# Patient Record
Sex: Female | Born: 1960
Health system: Southern US, Community
[De-identification: ages and names within clinical notes are randomized; demographics above are authoritative.]

## PROBLEM LIST (undated history)

## (undated) DIAGNOSIS — E079 Disorder of thyroid, unspecified: Secondary | ICD-10-CM

## (undated) DIAGNOSIS — N6019 Diffuse cystic mastopathy of unspecified breast: Secondary | ICD-10-CM

## (undated) DIAGNOSIS — E785 Hyperlipidemia, unspecified: Secondary | ICD-10-CM

## (undated) DIAGNOSIS — D649 Anemia, unspecified: Secondary | ICD-10-CM

## (undated) DIAGNOSIS — N924 Excessive bleeding in the premenopausal period: Secondary | ICD-10-CM

## (undated) DIAGNOSIS — E663 Overweight: Secondary | ICD-10-CM

## (undated) DIAGNOSIS — I10 Essential (primary) hypertension: Secondary | ICD-10-CM

## (undated) HISTORY — DX: Essential (primary) hypertension: I10

## (undated) HISTORY — DX: Overweight: E66.3

## (undated) HISTORY — DX: Anemia, unspecified: D64.9

## (undated) HISTORY — DX: Hyperlipidemia, unspecified: E78.5

## (undated) HISTORY — DX: Disorder of thyroid, unspecified: E07.9

## (undated) HISTORY — DX: Excessive bleeding in the premenopausal period: N92.4

## (undated) HISTORY — DX: Diffuse cystic mastopathy of unspecified breast: N60.19

## (undated) HISTORY — PX: TUBAL LIGATION: SHX77

---

## 2003-11-29 ENCOUNTER — Ambulatory Visit: Payer: Self-pay | Admitting: General Surgery

## 2004-12-06 ENCOUNTER — Ambulatory Visit: Payer: Self-pay | Admitting: General Surgery

## 2005-12-24 ENCOUNTER — Ambulatory Visit: Payer: Self-pay | Admitting: General Surgery

## 2006-03-03 ENCOUNTER — Ambulatory Visit: Payer: Self-pay | Admitting: Unknown Physician Specialty

## 2007-02-19 DIAGNOSIS — N924 Excessive bleeding in the premenopausal period: Secondary | ICD-10-CM

## 2007-02-19 HISTORY — DX: Excessive bleeding in the premenopausal period: N92.4

## 2007-02-19 HISTORY — PX: HYSTEROSCOPY: SHX211

## 2007-03-12 ENCOUNTER — Ambulatory Visit: Payer: Self-pay | Admitting: General Surgery

## 2007-12-04 ENCOUNTER — Ambulatory Visit: Payer: Self-pay | Admitting: Unknown Physician Specialty

## 2007-12-08 ENCOUNTER — Ambulatory Visit: Payer: Self-pay | Admitting: Unknown Physician Specialty

## 2008-02-19 HISTORY — PX: BIOPSY THYROID: PRO38

## 2008-05-04 ENCOUNTER — Ambulatory Visit: Payer: Self-pay | Admitting: General Surgery

## 2008-09-29 ENCOUNTER — Ambulatory Visit: Payer: Self-pay | Admitting: Unknown Physician Specialty

## 2009-02-18 DIAGNOSIS — E079 Disorder of thyroid, unspecified: Secondary | ICD-10-CM

## 2009-02-18 DIAGNOSIS — N6019 Diffuse cystic mastopathy of unspecified breast: Secondary | ICD-10-CM

## 2009-02-18 HISTORY — PX: BREAST BIOPSY: SHX20

## 2009-02-18 HISTORY — PX: COLONOSCOPY: SHX174

## 2009-02-18 HISTORY — DX: Disorder of thyroid, unspecified: E07.9

## 2009-02-18 HISTORY — DX: Diffuse cystic mastopathy of unspecified breast: N60.19

## 2009-03-10 ENCOUNTER — Ambulatory Visit: Payer: Self-pay | Admitting: Gastroenterology

## 2009-05-05 ENCOUNTER — Ambulatory Visit: Payer: Self-pay | Admitting: General Surgery

## 2009-12-22 IMAGING — US US THYROID
1 series · 17 of 25 positions shown · non-contrast
Comparison: none

REASON FOR EXAM: right thyroid nodule
COMMENTS:

[Series 1: us thyroid · 17 of 33 slices shown]
[im 1/33]
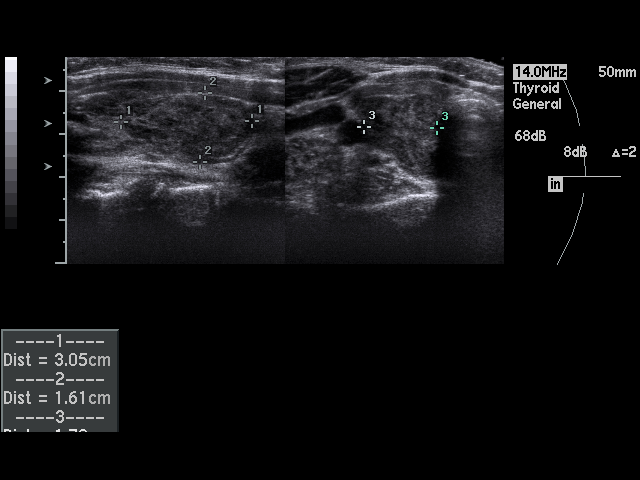
[im 3/33]
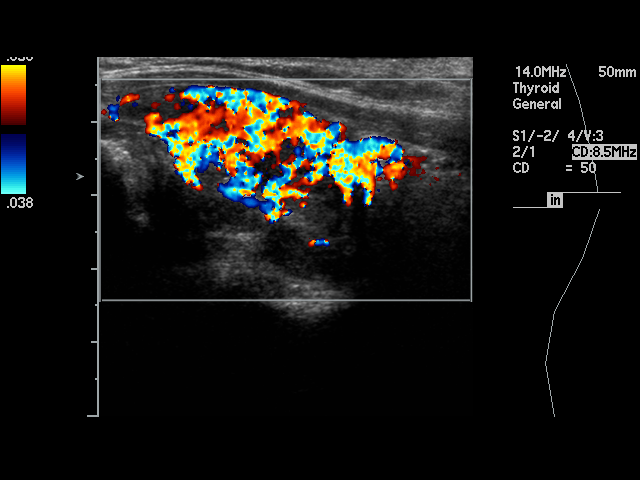
[im 5/33]
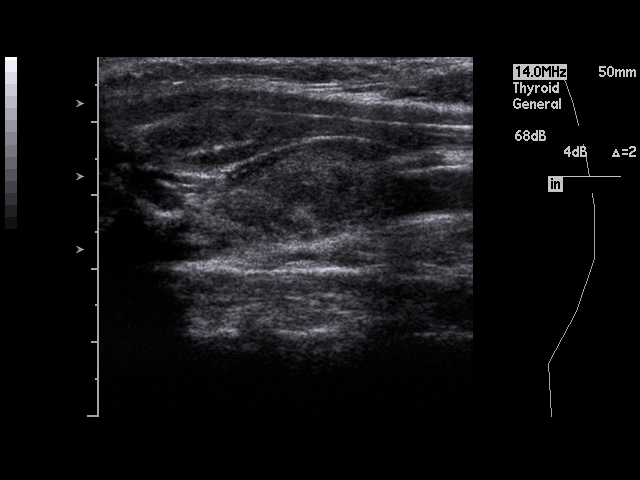
[im 7/33]
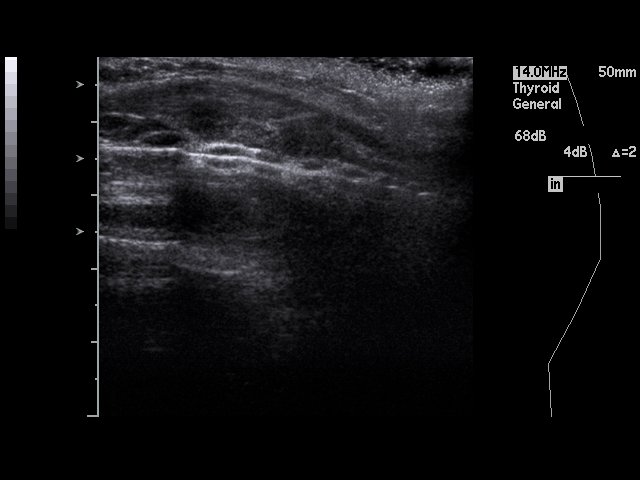
[im 9/33]
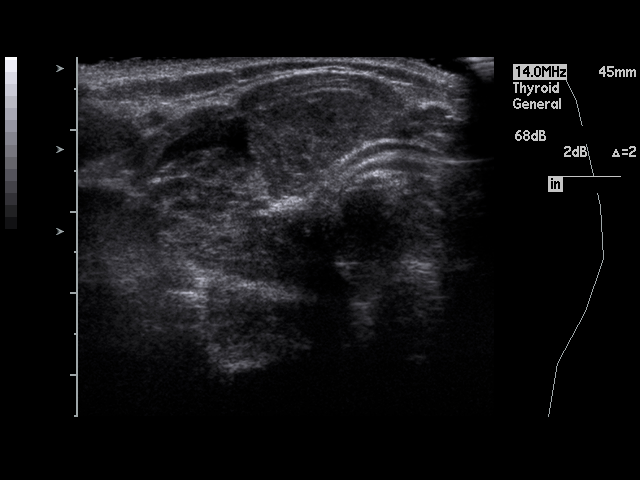
[im 11/33]
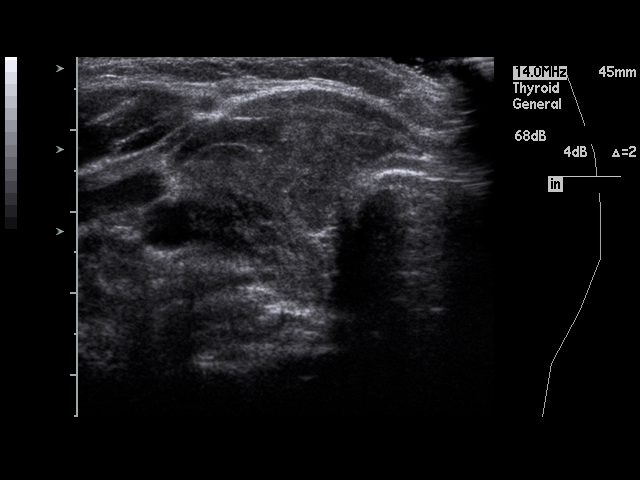
[im 13/33]
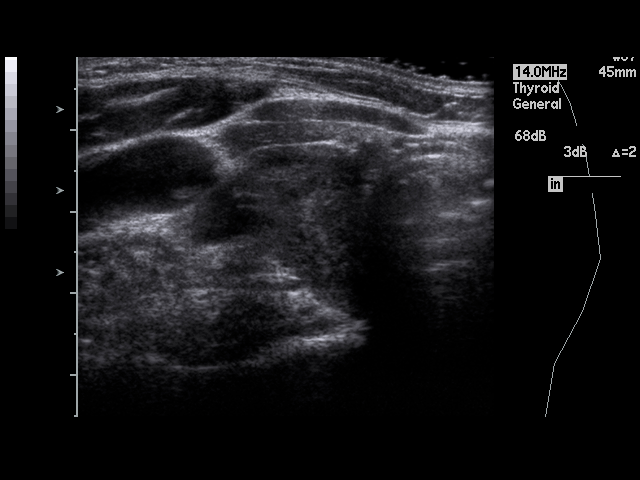
[im 15/33]
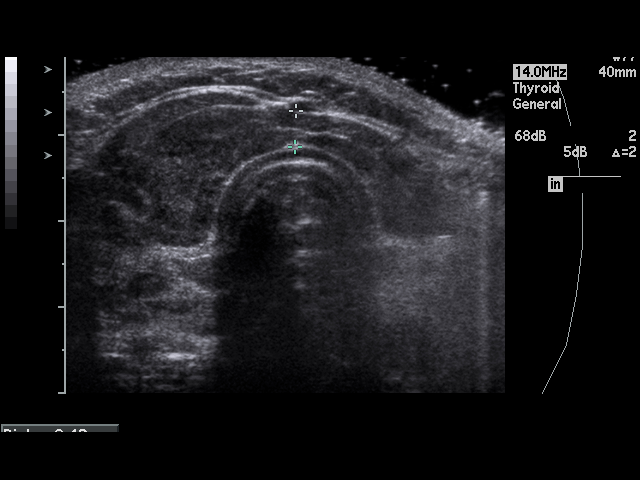
[im 17/33]
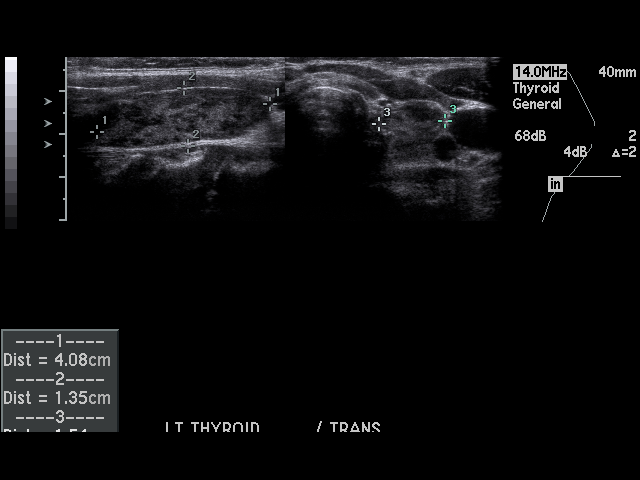
[im 18/33]
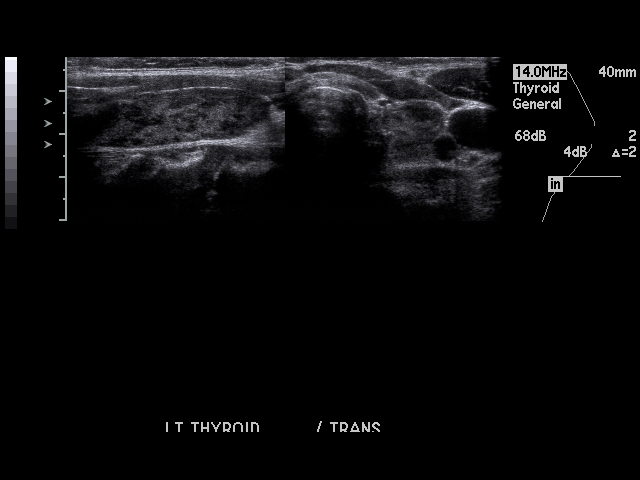
[im 21/33]
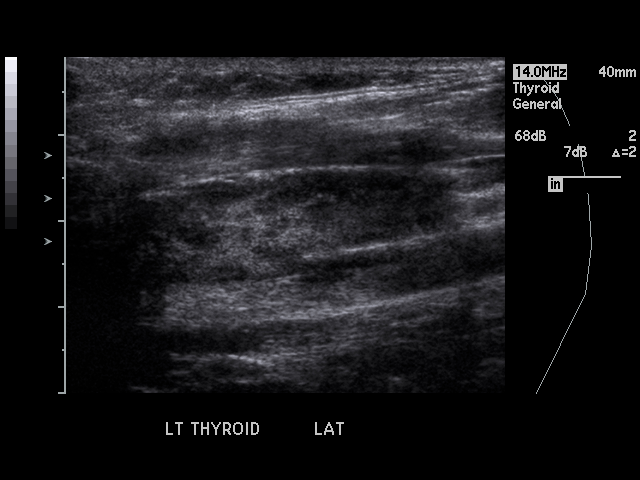
[im 22/33]
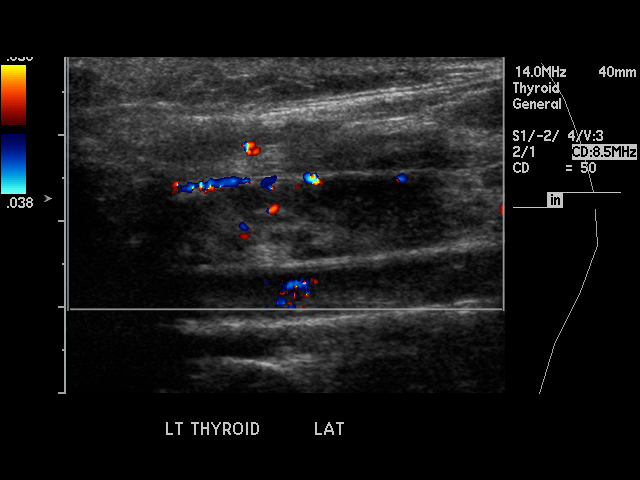
[im 25/33]
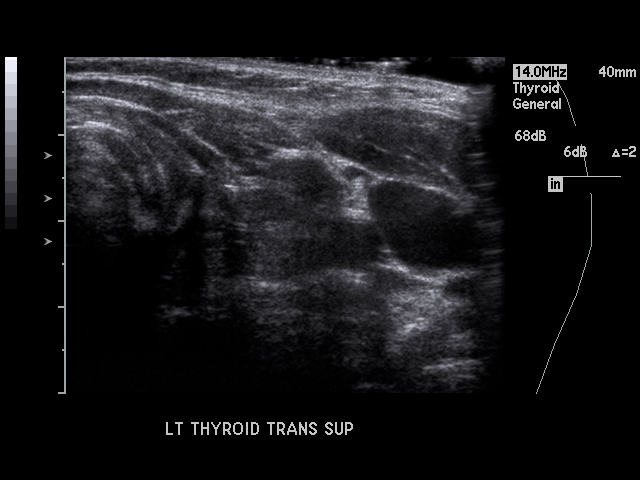
[im 26/33]
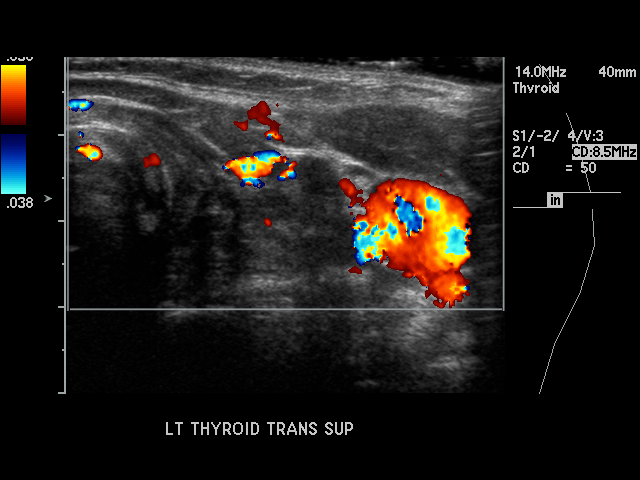
[im 29/33]
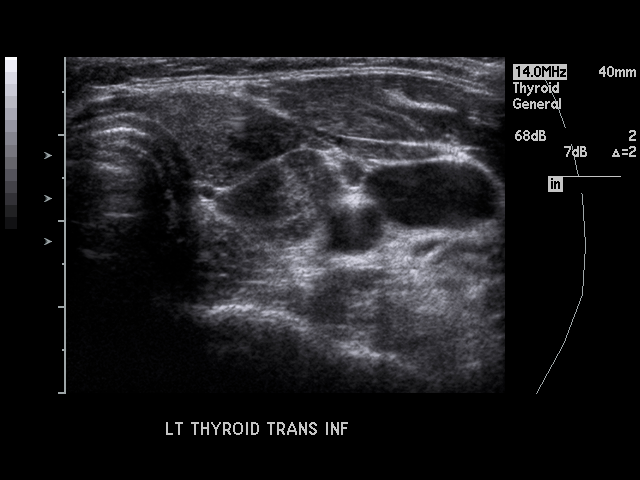
[im 30/33]
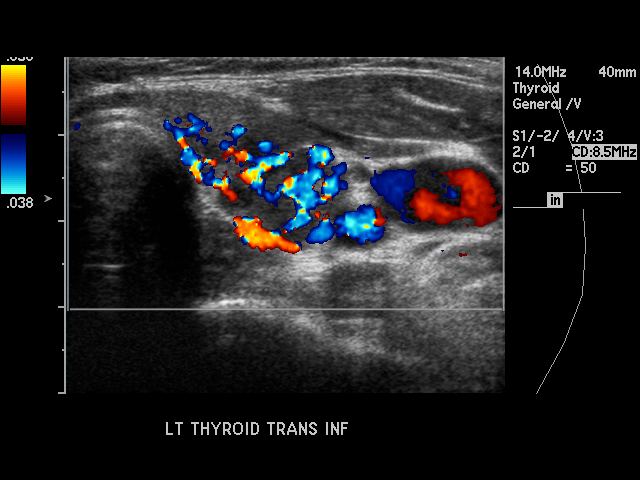
[im 33/33]
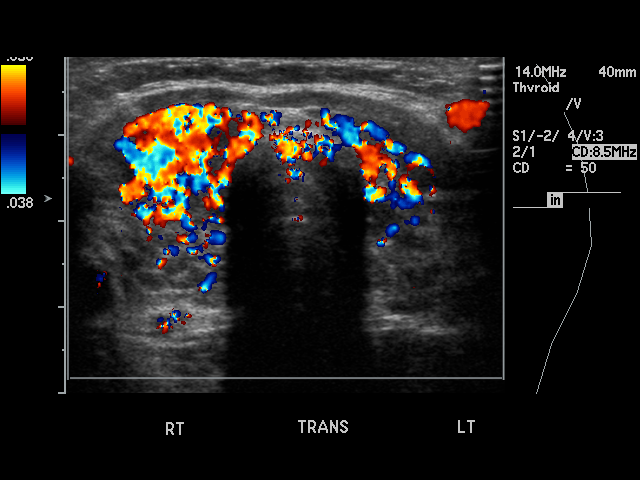

[17 of 25 positions shown; findings below may reference images not displayed]

PROCEDURE:     US  - US THYROID  - September 29, 2008  [DATE]

RESULT:     The right lobe of the thyroid measures 3.05 cm x 1.61 cm x
cm and the left lobe measures 4.08 cm x 1.35 cm x 1.54 cm. No focal thyroid
nodules or masses are seen. No thyroid calcifications are seen. The thyroid
echotexture is heterogeneous which would raise the question of thyroiditis.
IMPRESSION: 1.  No thyroid masses or nodules are seen.
2.  The thyroid echotexture is heterogeneous bilaterally. The finding is
nonspecific but can be the sequela of thyroiditis.

## 2010-05-23 ENCOUNTER — Ambulatory Visit: Payer: Self-pay | Admitting: General Surgery

## 2011-04-12 ENCOUNTER — Encounter: Payer: Self-pay | Admitting: Internal Medicine

## 2011-04-12 ENCOUNTER — Ambulatory Visit (INDEPENDENT_AMBULATORY_CARE_PROVIDER_SITE_OTHER): Payer: BC Managed Care – PPO | Admitting: Internal Medicine

## 2011-04-12 DIAGNOSIS — E663 Overweight: Secondary | ICD-10-CM

## 2011-04-12 DIAGNOSIS — I10 Essential (primary) hypertension: Secondary | ICD-10-CM

## 2011-04-12 DIAGNOSIS — Z6825 Body mass index (BMI) 25.0-25.9, adult: Secondary | ICD-10-CM

## 2011-04-12 NOTE — Progress Notes (Signed)
Subjective:    Patient ID: Kaitlyn Cortez, female    DOB: [birth year], 51 y.o.   MRN: [id number]  HPI   Kaitlyn Cortez is a 51 yr old white female with a history of hypertension and hypothyroidism who is transferring care for [facility].  Her cc is overweight.  She is not following a diet currently and walks occasionally for exercise.  Her weight gain has been recurrent since her 30's/  She has had previous success with Weight Watchers but doesn't have time currently for the program because of the documentation needed .  She denies chest pain with exertion and has no history of sleep apnea.   Past Medical History  Diagnosis Date  . Menopausal menorrhagia 2009    hysterosocopy was normal,  menses ended 2009  . Overweight (BMI 25.0-29.9)   . Thyroid disease   . Hypertension    No current outpatient prescriptions on file prior to visit.     Review of Systems  Constitutional: Negative for fever, chills and unexpected weight change.  HENT: Negative for hearing loss, ear pain, nosebleeds, congestion, sore throat, facial swelling, rhinorrhea, sneezing, mouth sores, trouble swallowing, neck pain, neck stiffness, voice change, postnasal drip, sinus pressure, tinnitus and ear discharge.   Eyes: Negative for pain, discharge, redness and visual disturbance.  Respiratory: Negative for cough, chest tightness, shortness of breath, wheezing and stridor.   Cardiovascular: Negative for chest pain, palpitations and leg swelling.  Musculoskeletal: Negative for myalgias and arthralgias.  Skin: Negative for color change and rash.  Neurological: Negative for dizziness, weakness, light-headedness and headaches.  Hematological: Negative for adenopathy.       Objective:   Physical Exam  Constitutional: She is oriented to person, place, and time. She appears well-developed and well-nourished.  HENT:  Mouth/Throat: Oropharynx is clear and moist.  Eyes: EOM are normal. Pupils are equal, round, and reactive  to light. No scleral icterus.  Neck: Normal range of motion. Neck supple. No JVD present. No thyromegaly present.  Cardiovascular: Normal rate, regular rhythm, normal heart sounds and intact distal pulses.   Pulmonary/Chest: Effort normal and breath sounds normal.  Abdominal: Soft. Bowel sounds are normal. She exhibits no mass. There is no tenderness.  Musculoskeletal: Normal range of motion. She exhibits no edema.  Lymphadenopathy:    She has no cervical adenopathy.  Neurological: She is alert and oriented to person, place, and time.  Skin: Skin is warm and dry.  Psychiatric: She has a normal mood and affect.       Assessment & Plan: Overweight (BMI 25.0-29.9) Discussed low glycemic index diet,  6 smaller meals daily,  And need for daily exercise.   Hypertension well controlled  On current regimen. No changes today.     Updated Medication List Outpatient Encounter Prescriptions as of 04/12/2011  Medication Sig Dispense Refill  . levothyroxine (LEVOTHROID) 25 MCG tablet Take 25 mcg by mouth daily.      . Triamterene-HCTZ (MAXZIDE PO) Take one by mouth daily, patient unsure of strength          Overweight (BMI 25.0-29.9) Spent 20 minutes discussing low glycemic index diet,  6 smaller meals daily,  And need for daily.  Baseline labs ordered.   Hypertension well controlled  On current regimen. No changes today.     Updated Medication List Outpatient Encounter Prescriptions as of 04/12/2011  Medication Sig Dispense Refill  . levothyroxine (LEVOTHROID) 25 MCG tablet Take 25 mcg by mouth daily.      . Triamterene-HCTZ (  MAXZIDE PO) Take one by mouth daily, patient unsure of strength

## 2011-04-12 NOTE — Patient Instructions (Addendum)
Low glycemic index diet, eating 6 smaller meals daily  Protein  Shakes (EAS Carb Control  Or Atkins ,  Available everywhere,   In  cases at BJs )  2.5 carbs  Protein bar by Atkins (snack size,  BJ's)  At 10 am  Lunch: sandwich on pita bread (Joseph's makes a pita bread and a flat bread , available at [employer] and BJ's)   Mid day :  Another protein bar,  Or cheese stick, almonds, walnuts, pistachios, pecans, peanuts,  Macadamia nuts  Lunch:  "mean and green:"  Meat, salad, and green veggie : use ranch, vinagrette,  Blue cheese, etc  Evening : Breyer's low carb fudgiscle, ice cream (Carb Smart)   

## 2011-04-14 ENCOUNTER — Encounter: Payer: Self-pay | Admitting: Internal Medicine

## 2011-04-14 DIAGNOSIS — E663 Overweight: Secondary | ICD-10-CM | POA: Insufficient documentation

## 2011-04-14 DIAGNOSIS — E039 Hypothyroidism, unspecified: Secondary | ICD-10-CM | POA: Insufficient documentation

## 2011-04-14 NOTE — Assessment & Plan Note (Addendum)
Spent 20 minutes discussing low glycemic index diet,  6 smaller meals daily,  And need for daily.  Baseline labs ordered.

## 2011-04-14 NOTE — Assessment & Plan Note (Signed)
well controlled  On current regimen. No changes today.

## 2011-05-23 ENCOUNTER — Ambulatory Visit: Payer: Self-pay | Admitting: General Surgery

## 2011-06-04 ENCOUNTER — Other Ambulatory Visit: Payer: Self-pay | Admitting: Internal Medicine

## 2011-06-04 MED ORDER — TRIAMTERENE-HCTZ 37.5-25 MG PO TABS: 1.0000 | ORAL_TABLET | Freq: Every day | ORAL | Status: DC

## 2011-07-03 ENCOUNTER — Other Ambulatory Visit: Payer: Self-pay | Admitting: General Surgery

## 2011-07-03 LAB — TSH: Thyroid Stimulating Horm: 1.36 u[IU]/mL

## 2011-07-10 ENCOUNTER — Encounter: Payer: Self-pay | Admitting: Internal Medicine

## 2011-07-10 ENCOUNTER — Ambulatory Visit (INDEPENDENT_AMBULATORY_CARE_PROVIDER_SITE_OTHER): Payer: BC Managed Care – PPO | Admitting: Internal Medicine

## 2011-07-10 VITALS — BP 142/80 | HR 70 | Temp 98.3°F | Resp 16 | Wt 142.8 lb

## 2011-07-10 DIAGNOSIS — I10 Essential (primary) hypertension: Secondary | ICD-10-CM

## 2011-07-10 DIAGNOSIS — E785 Hyperlipidemia, unspecified: Secondary | ICD-10-CM

## 2011-07-10 DIAGNOSIS — E663 Overweight: Secondary | ICD-10-CM

## 2011-07-10 DIAGNOSIS — E079 Disorder of thyroid, unspecified: Secondary | ICD-10-CM

## 2011-07-10 NOTE — Progress Notes (Signed)
Patient ID: Kaitlyn Cortez, female   DOB: [birth year], 51 y.o.   MRN: [id number]   Patient Active Problem List  Diagnoses  . Hypertension  . Thyroid disease  . Overweight (BMI 25.0-29.9)  . Hyperlipidemia  . Anemia    Subjective:  CC:   Chief Complaint  Patient presents with  . Follow-up    HPI:   Kaitlyn Cortez a 51 y.o. female who presents for 3 month follow up on low glycemic index diet and hypothyroidism.  She has more energy and less fatigue on followup.  Her thyroid was low 8 weeks ago and dose was increased with repeat level improved.  She is better .  She is walking daily  But admits that her level of intensity is low .   Past Medical History  Diagnosis Date  . Menopausal menorrhagia 2009    hysterosocopy was normal,  menses ended 2009  . Overweight (BMI 25.0-29.9)   . Hyperlipidemia   . hypothyroidism   . Hypertension   . Anemia     Past Surgical History  Procedure Date  . Biopsy thyroid 2010    normal, Evette Cristal  takes low dose synthroid for suppression  . Tubal ligation   . Breast biopsy     fibrocystic breast disease         The following portions of the patient's history were reviewed and updated as appropriate: Allergies, current medications, and problem list.    Review of Systems:   12 Pt  review of systems was negative except those addressed in the HPI,     History   Social History  . Marital Status: Married    Spouse Name: N/A    Number of Children: N/A  . Years of Education: N/A   Occupational History  . Not on file.   Social History Main Topics  . Smoking status: Never Smoker   . Smokeless tobacco: Never Used  . Alcohol Use: Yes  . Drug Use: No  . Sexually Active: Not on file   Other Topics Concern  . Not on file   Social History Narrative  . No narrative on file    Objective:  BP 142/80  Pulse 70  Temp(Src) 98.3 F (36.8 C) (Oral)  Resp 16  Wt 142 lb 12 oz (64.751 kg)  SpO2 98%  General appearance: alert,  cooperative and appears stated age Ears: normal TM's and external ear canals both ears Throat: lips, mucosa, and tongue normal; teeth and gums normal Neck: no adenopathy, no carotid bruit, supple, symmetrical, trachea midline and thyroid not enlarged, symmetric, no tenderness/mass/nodules Back: symmetric, no curvature. ROM normal. No CVA tenderness. Lungs: clear to auscultation bilaterally Heart: regular rate and rhythm, S1, S2 normal, no murmur, click, rub or gallop Abdomen: soft, non-tender; bowel sounds normal; no masses,  no organomegaly Pulses: 2+ and symmetric Skin: Skin color, texture, turgor normal. No rashes or lesions Lymph nodes: Cervical, supraclavicular, and axillary nodes normal.  Assessment and Plan:  Overweight (BMI 25.0-29.9) Improving with exercise and low glycemin index diet. Wt loss of 7 lbs noted.  Return in 3 months  Hypertension Well controlled on current medications.  No changes today.  Hyperlipidemia Last LDL was 107 by [facility] records in December.  Repeat due prior to next visit.   Thyroid disease Stable MNG by  prior records,  TSH was subtherapuetic and dose was increased at last visit.     Updated Medication List Outpatient Encounter Prescriptions as of 07/10/2011  Medication Sig Dispense Refill  .  levothyroxine (SYNTHROID, LEVOTHROID) 75 MCG tablet Take 75 mcg by mouth daily.      Marland Kitchen triamterene-hydrochlorothiazide (MAXZIDE-25) 37.5-25 MG per tablet Take 1 each (1 tablet total) by mouth daily.  90 tablet  3  . DISCONTD: levothyroxine (LEVOTHROID) 25 MCG tablet Take 25 mcg by mouth daily.         Orders Placed This Encounter  Procedures  . HM COLONOSCOPY    Return in about 3 months (around 10/10/2011).

## 2011-07-10 NOTE — Patient Instructions (Signed)
Sign up for my chart so you can e mail me your questions (nonurgent)

## 2011-07-14 ENCOUNTER — Encounter: Payer: Self-pay | Admitting: Internal Medicine

## 2011-07-14 DIAGNOSIS — D649 Anemia, unspecified: Secondary | ICD-10-CM | POA: Insufficient documentation

## 2011-07-14 DIAGNOSIS — E785 Hyperlipidemia, unspecified: Secondary | ICD-10-CM | POA: Insufficient documentation

## 2011-07-14 NOTE — Assessment & Plan Note (Signed)
Last LDL was 107 by [facility] records in December.  Repeat due prior to next visit.

## 2011-07-14 NOTE — Assessment & Plan Note (Signed)
Improving with exercise and low glycemin index diet. Wt loss of 7 lbs noted.  Return in 3 months

## 2011-07-14 NOTE — Assessment & Plan Note (Signed)
Stable MNG by  prior records,  TSH was subtherapuetic and dose was increased at last visit.

## 2011-07-14 NOTE — Assessment & Plan Note (Signed)
Well controlled on current medications.  No changes today. 

## 2011-08-19 ENCOUNTER — Ambulatory Visit (INDEPENDENT_AMBULATORY_CARE_PROVIDER_SITE_OTHER): Payer: BC Managed Care – PPO | Admitting: Internal Medicine

## 2011-08-19 ENCOUNTER — Encounter: Payer: Self-pay | Admitting: Internal Medicine

## 2011-08-19 VITALS — BP 114/70 | HR 68 | Temp 97.6°F | Resp 16

## 2011-08-19 DIAGNOSIS — E079 Disorder of thyroid, unspecified: Secondary | ICD-10-CM

## 2011-08-19 DIAGNOSIS — J069 Acute upper respiratory infection, unspecified: Secondary | ICD-10-CM | POA: Insufficient documentation

## 2011-08-19 MED ORDER — AMOXICILLIN-POT CLAVULANATE 875-125 MG PO TABS: 1.0000 | ORAL_TABLET | Freq: Two times a day (BID) | ORAL | Status: AC

## 2011-08-19 MED ORDER — PSEUDOEPHEDRINE-CODEINE-GG 30-10-100 MG/5ML PO SOLN: 10.0000 mL | Freq: Four times a day (QID) | ORAL | Status: AC | PRN

## 2011-08-19 NOTE — Progress Notes (Signed)
Patient ID: Kaitlyn Cortez, female   DOB: 02-11-1961, 51 y.o.   MRN: [id number] Patient Active Problem List  Diagnosis  . Hypertension  . Thyroid disease  . Overweight (BMI 25.0-29.9)  . Hyperlipidemia  . Anemia  . URI, acute    Subjective:  CC:   Chief Complaint  Patient presents with  . Sore Throat  . Cough    HPI:   Kaitlyn Cortez a 51 y.o. female who presents ough, sore throat.  After spending a week at the beach she developed post nasal drip and recurrent nonproductive cough with raspy voice and sore throat.  Sister in law having similar symptoms. No fevers, facial pain, purulent nasal drainage or  Pleurisy.  No sneezing or allergy symptoms.  Cough is constant,  using robitussin and  chloraseptic with no significant change.   Past Medical History  Diagnosis Date  . Menopausal menorrhagia 2009    hysterosocopy was normal,  menses ended 2009  . Overweight (BMI 25.0-29.9)   . Hyperlipidemia   . hypothyroidism   . Hypertension   . Anemia     Past Surgical History  Procedure Date  . Biopsy thyroid 2010    normal, Evette Cristal  takes low dose synthroid for suppression  . Tubal ligation   . Breast biopsy     fibrocystic breast disease         The following portions of the patient's history were reviewed and updated as appropriate: Allergies, current medications, and problem list.    Review of Systems:   12 Pt  review of systems was negative except those addressed in the HPI,     History   Social History  . Marital Status: Married    Spouse Name: N/A    Number of Children: N/A  . Years of Education: N/A   Occupational History  . Not on file.   Social History Main Topics  . Smoking status: Never Smoker   . Smokeless tobacco: Never Used  . Alcohol Use: Yes  . Drug Use: No  . Sexually Active: Not on file   Other Topics Concern  . Not on file   Social History Narrative  . No narrative on file    Objective:  BP 114/70  Pulse 68  Temp 97.6 F  (36.4 C) (Oral)  Resp 16  SpO2 97%  General appearance: alert, cooperative and appears stated age Ears: normal TM's and external ear canals both ears Throat: lips, mucosa, and tongue normal; tonisllar pillars are red without congestion or ulceration Neck: no adenopathy, no carotid bruit, supple, symmetrical, trachea midline and thyroid not enlarged, symmetric, no tenderness/mass/nodules Back: symmetric, no curvature. ROM normal. No CVA tenderness. Lungs: clear to auscultation bilaterally Heart: regular rate and rhythm, S1, S2 normal, no murmur, click, rub or gallop Lymph nodes: Cervical, supraclavicular, and axillary nodes normal.  Assessment and Plan: URI, acute Given absence of facial pain and exam consistent with vital URI,  Will treat with cough suppressants decongestants, and saline lavage.  Adding augmentin if no improvement in 4 days   Thyroid disease Repeat TSH on 75 mcg doe has been normal per patient. Checked by [employer].    Updated Medication List Outpatient Encounter Prescriptions as of 08/19/2011  Medication Sig Dispense Refill  . levothyroxine (SYNTHROID, LEVOTHROID) 75 MCG tablet Take 75 mcg by mouth daily.      Marland Kitchen triamterene-hydrochlorothiazide (MAXZIDE-25) 37.5-25 MG per tablet Take 1 each (1 tablet total) by mouth daily.  90 tablet  3  . amoxicillin-clavulanate (AUGMENTIN)  875-125 MG per tablet Take 1 tablet by mouth 2 (two) times daily.  14 tablet  0  . pseudoephedrine-codeine-guaifenesin (CHERATUSSIN DAC) 30-10-100 MG/5ML solution Take 10 mLs by mouth 4 (four) times daily as needed for cough.  480 mL  0

## 2011-08-19 NOTE — Assessment & Plan Note (Addendum)
Repeat TSH on 75 mcg doe has been normal per patient. Checked by [employer].

## 2011-08-19 NOTE — Patient Instructions (Addendum)
You have a viral  Syndrome .  The post nasal drip is causing your sore throat.  Lavage your sinuses twice daly with Simply saline nasal spray.  Use benadryl 25 mg every 8 hours and Sudafed PE 10 to 30 every 8 hours to manage the drainage and congestion.  Gargle with salt water often for the sore throat.  I am calling in Cheratussin cough syrup (has codeine) for the cough.  If the throat is no better  In 3 to 4 days OR  if you develop T > 100.4,  Green nasal discharge,  Or facial pain,  Start the  antibiotic.   

## 2011-08-19 NOTE — Assessment & Plan Note (Signed)
Given absence of facial pain and exam consistent with vital URI,  Will treat with cough suppressants decongestants, and saline lavage.  Adding augmentin if no improvement in 4 days

## 2011-10-10 ENCOUNTER — Ambulatory Visit: Payer: BC Managed Care – PPO | Admitting: Internal Medicine

## 2012-04-10 ENCOUNTER — Encounter: Payer: Self-pay | Admitting: *Deleted

## 2012-04-10 DIAGNOSIS — N6019 Diffuse cystic mastopathy of unspecified breast: Secondary | ICD-10-CM | POA: Insufficient documentation

## 2012-05-25 ENCOUNTER — Encounter: Payer: Self-pay | Admitting: General Surgery

## 2012-05-25 ENCOUNTER — Ambulatory Visit: Payer: Self-pay | Admitting: General Surgery

## 2012-06-08 ENCOUNTER — Encounter: Payer: Self-pay | Admitting: *Deleted

## 2012-06-09 ENCOUNTER — Ambulatory Visit (INDEPENDENT_AMBULATORY_CARE_PROVIDER_SITE_OTHER): Payer: BC Managed Care – PPO | Admitting: General Surgery

## 2012-06-09 ENCOUNTER — Encounter: Payer: Self-pay | Admitting: General Surgery

## 2012-06-09 ENCOUNTER — Other Ambulatory Visit: Payer: Self-pay | Admitting: *Deleted

## 2012-06-09 VITALS — BP 124/84 | HR 70 | Resp 12 | Ht 61.0 in | Wt 149.0 lb

## 2012-06-09 DIAGNOSIS — N6019 Diffuse cystic mastopathy of unspecified breast: Secondary | ICD-10-CM

## 2012-06-09 DIAGNOSIS — Z1231 Encounter for screening mammogram for malignant neoplasm of breast: Secondary | ICD-10-CM

## 2012-06-09 NOTE — Progress Notes (Signed)
Patient ID: Kaitlyn Cortez, female   DOB: 05-06-1960, 52 y.o.   MRN: [id number]  Chief Complaint  Patient presents with  . Follow-up    mammogram    HPI Kaitlyn Cortez is a 52 y.o. female who presents for a follow up mammogram. The most recent mammogram was done on 05/25/12 at [facility] with a birad category 2. No new problems with the breast. The patient gets regular mammograms and checks her breasts on occasion. No known family history. The patient has a history of fibrocystic breast disease.  HPI  Past Medical History  Diagnosis Date  . Menopausal menorrhagia 2009    hysterosocopy was normal,  menses ended 2009  . Overweight (BMI 25.0-29.9)   . Hyperlipidemia   . hypothyroidism   . Hypertension   . Anemia   . Diffuse cystic mastopathy 2011  . Unspecified disorder of thyroid 2011    Past Surgical History  Procedure Laterality Date  . Biopsy thyroid  2010    normal, Evette Cristal  takes low dose synthroid for suppression  . Tubal ligation    . Breast biopsy  2011    fibrocystic breast disease  . Hysteroscopy  2009  . Colonoscopy  2011    Family History  Problem Relation Age of Onset  . Heart disease Father   . Heart disease Maternal Uncle   . Depression Daughter     Social History History  Substance Use Topics  . Smoking status: Never Smoker   . Smokeless tobacco: Never Used  . Alcohol Use: Yes    No Known Allergies  Current Outpatient Prescriptions  Medication Sig Dispense Refill  . levothyroxine (SYNTHROID, LEVOTHROID) 75 MCG tablet Take 75 mcg by mouth daily.      Marland Kitchen triamterene-hydrochlorothiazide (MAXZIDE-25) 37.5-25 MG per tablet Take 1 each (1 tablet total) by mouth daily.  90 tablet  3   No current facility-administered medications for this visit.    Review of Systems Review of Systems  Constitutional: Negative.   Respiratory: Negative.   Cardiovascular: Negative.     Blood pressure 124/84, pulse 70, resp. rate 12, height 5\' 1"  (1.549 m), weight 149 lb  (67.586 kg).  Physical Exam Physical Exam  Constitutional: She appears well-developed and well-nourished.  Neck: Normal range of motion. Neck supple.  Cardiovascular: Normal rate and normal heart sounds.   Pulmonary/Chest: Effort normal and breath sounds normal. Right breast exhibits no inverted nipple, no mass, no nipple discharge, no skin change and no tenderness. Left breast exhibits no inverted nipple, no mass, no nipple discharge, no skin change and no tenderness.  Abdominal: Soft. Bowel sounds are normal.  Lymphadenopathy:    She has no cervical adenopathy.    She has no axillary adenopathy.    Data Reviewed Mammogram reviewed and is stable  Assessment    Stable exam    Plan    Routine yearly followup       Verley Pariseau G 06/10/2012, 6:38 AM

## 2012-06-09 NOTE — Patient Instructions (Addendum)
Return in one year with mammogram.

## 2012-06-09 NOTE — Progress Notes (Signed)
Patient will be asked to return to the office in one year for a bilateral screening mammogram. 

## 2012-06-10 ENCOUNTER — Encounter: Payer: Self-pay | Admitting: General Surgery

## 2012-07-31 ENCOUNTER — Other Ambulatory Visit: Payer: Self-pay | Admitting: Internal Medicine

## 2012-07-31 ENCOUNTER — Telehealth: Payer: Self-pay | Admitting: Internal Medicine

## 2012-07-31 NOTE — Telephone Encounter (Signed)
Triamterene-HCTZ (MAXZIDE PO)

## 2012-07-31 NOTE — Telephone Encounter (Signed)
Has CPE scheduled 09/18/12.

## 2012-07-31 NOTE — Telephone Encounter (Signed)
Refill done, see refill note.

## 2012-09-17 ENCOUNTER — Telehealth: Payer: Self-pay | Admitting: *Deleted

## 2012-09-17 NOTE — Telephone Encounter (Signed)
Patient called wanting a precsription refill on Synthroid 0.075 mg. She uses [facility] in [place]. Thanks.

## 2012-09-18 ENCOUNTER — Encounter: Payer: Self-pay | Admitting: Internal Medicine

## 2012-09-18 ENCOUNTER — Ambulatory Visit (INDEPENDENT_AMBULATORY_CARE_PROVIDER_SITE_OTHER): Payer: BC Managed Care – PPO | Admitting: Internal Medicine

## 2012-09-18 VITALS — BP 134/78 | HR 63 | Temp 98.1°F | Resp 14 | Ht 61.0 in | Wt 146.2 lb

## 2012-09-18 DIAGNOSIS — E663 Overweight: Secondary | ICD-10-CM

## 2012-09-18 DIAGNOSIS — D649 Anemia, unspecified: Secondary | ICD-10-CM

## 2012-09-18 DIAGNOSIS — I10 Essential (primary) hypertension: Secondary | ICD-10-CM

## 2012-09-18 DIAGNOSIS — N6019 Diffuse cystic mastopathy of unspecified breast: Secondary | ICD-10-CM

## 2012-09-18 DIAGNOSIS — E039 Hypothyroidism, unspecified: Secondary | ICD-10-CM

## 2012-09-18 DIAGNOSIS — Z6825 Body mass index (BMI) 25.0-25.9, adult: Secondary | ICD-10-CM

## 2012-09-18 DIAGNOSIS — Z23 Encounter for immunization: Secondary | ICD-10-CM

## 2012-09-18 DIAGNOSIS — E785 Hyperlipidemia, unspecified: Secondary | ICD-10-CM

## 2012-09-18 DIAGNOSIS — Z1211 Encounter for screening for malignant neoplasm of colon: Secondary | ICD-10-CM

## 2012-09-18 LAB — COMPREHENSIVE METABOLIC PANEL
Albumin: 4.6 g/dL (ref 3.5–5.2)
BUN: 14 mg/dL (ref 6–23)
Calcium: 10.1 mg/dL (ref 8.4–10.5)
Chloride: 98 mEq/L (ref 96–112)
Glucose, Bld: 86 mg/dL (ref 70–99)
Potassium: 4.2 mEq/L (ref 3.5–5.1)

## 2012-09-18 LAB — LIPID PANEL: Cholesterol: 210 mg/dL — ABNORMAL HIGH (ref 0–200)

## 2012-09-18 LAB — TSH: TSH: 2.12 u[IU]/mL (ref 0.35–5.50)

## 2012-09-18 NOTE — Patient Instructions (Addendum)
You had your annual wellness exam today  Please use the stool kit to send [place] back a sample to test for blood.  This is  your colon CA screening test since you have 10 years between colonoscopies   You had your TDaP vaccine  Today.  We will contact you with the bloodwork results.  This is  One version of a  "Low GI"  Diet:   Your goal with exercise is a minimum of 30 minutes of aerobic exercise 5 days per week (Walking does not count once it becomes easy!)    All of the foods can be found at grocery stores and in bulk at [employer].  The Atkins protein bars and shakes are available in more varieties at Target, WalMart and [employer]'s Foods.     7 AM Breakfast:  Choose from the following:  Low carbohydrate Protein  Shakes (I recommend the EAS AdvantEdge "Carb Control" shakes  Or the low carb shakes by Atkins.    2.5 carbs   Arnold's "Sandwhich Thin"toasted  w/ peanut butter (no jelly: about 20 net carbs  "Bagel Thin" with cream cheese and salmon: about 20 carbs   a scrambled egg/bacon/cheese burrito made with Mission's "carb balance" whole wheat tortilla  (about 10 net carbs )   Avoid cereal and bananas, oatmeal and cream of wheat and grits. They are loaded with carbohydrates!   10 AM: high protein snack  Protein bar by Atkins (the snack size, under 200 cal, usually < 6 net carbs).    A stick of cheese:  Around 1 carb,  100 cal     Dannon Light n Fit [place] Yogurt  (80 cal, 8 carbs)  Other so called "protein bars" and Greek yogurts tend to be loaded with carbohydrates.  Remember, in food advertising, the word "energy" is synonymous for " carbohydrate."  Lunch:   A Sandwich using the bread choices listed, Can use any  Eggs,  lunchmeat, grilled meat or canned tuna), avocado, regular mayo/mustard  and cheese.  A Salad using blue cheese, ranch,  Goddess or vinagrette,  No croutons or "confetti" and no "candied nuts" but regular nuts OK.   No pretzels or chips.  Pickles and miniature sweet peppers  are a good low carb alternative that provide a "crunch"  The bread is the only source of carbohydrate in a sandwich and  can be decreased by trying some of these alternatives to traditional loaf bread  Joseph's makes a pita bread and a flat bread that are 50 cal and 4 net carbs available at BJs and WalMart.  This can be toasted to use with hummous as well  Toufayan makes a low carb flatbread that's 100 cal and 9 net carbs available at [employer] and [employer] makes 2 sizes of  Low carb whole wheat tortilla  (The large one is 210 cal and 6 net carbs) Avoid "Low fat dressings, as well as Reyne Dumas and [address] dressings They are loaded with sugar!   3 PM/ Mid day  Snack:  Consider  1 ounce of  almonds, walnuts, pistachios, pecans, peanuts,  Macadamia nuts or a nut medley.  Avoid "granola"; the dried cranberries and raisins are loaded with carbohydrates. Mixed nuts as long as there are no raisins,  cranberries or dried fruit.     6 PM  Dinner:     Meat/fowl/fish with a green salad, and either broccoli, cauliflower, green beans, spinach, brussel sprouts or  Lima beans. DO NOT  BREAD THE PROTEIN!!      There is a low carb pasta by Dreamfield's that is acceptable and tastes great: only 5 digestible carbs/serving.( All grocery stores but BJs carry it )  Try Kai Levins Angelo's chicken piccata or chicken or eggplant parm over low carb pasta.(Lowes and BJs)   Clifton Custard Sanchez's "Carnitas" (pulled pork, no sauce,  0 carbs) or his beef pot roast to make a dinner burrito (at BJ's)  Pesto over low carb pasta (bj's sells a good quality pesto in the center refrigerated section of the deli   Whole wheat pasta is still full of digestible carbs and  Not as low in glycemic index as Dreamfield's.   Brown rice is still rice,  So skip the rice and noodles if you eat [place] or [place] (or at least limit to 1/2 cup)  9 PM snack :   Breyer's "low carb" fudgsicle or  ice cream bar (Carb Smart line), or  Weight  Watcher's ice cream bar , or another "no sugar added" ice cream;  a serving of fresh berries/cherries with whipped cream   Cheese or DANNON'S LlGHT [place] YOGURT  Avoid bananas, pineapple, grapes  and watermelon on a regular basis because they are high in sugar.  THINK OF THEM AS DESSERT  Remember that snack Substitutions should be less than 10 NET carbs per serving and meals < 20 carbs. Remember to subtract fiber grams to get the "net carbs."

## 2012-09-18 NOTE — Progress Notes (Signed)
Patient ID: Kaitlyn Cortez, female   DOB: 04/08/1960, 52 y.o.   MRN: [id number]    Subjective:     Kaitlyn Cortez is a 52 y.o. female and is here for a comprehensive physical exam. The patient reports no problems. She is not due for pap until 2015.,  Breast exam was done in April by Dr.  Evette Cristal.  Her next colonoscopy is not due until 2018.   skin check done today    History   Social History  . Marital Status: Married    Spouse Name: N/A    Number of Children: N/A  . Years of Education: N/A   Occupational History  . Not on file.   Social History Main Topics  . Smoking status: Never Smoker   . Smokeless tobacco: Never Used  . Alcohol Use: Yes  . Drug Use: No  . Sexually Active: Not on file   Other Topics Concern  . Not on file   Social History Narrative  . No narrative on file   Health Maintenance  Topic Date Due  . Influenza Vaccine  10/19/2012  . Pap Smear  02/08/2014  . Mammogram  05/26/2014  . Colonoscopy  07/14/2019  . Tetanus/tdap  09/19/2022    The following portions of the patient's history were reviewed and updated as appropriate: allergies, current medications, past family history, past medical history, past social history, past surgical history and problem list.  Review of Systems A comprehensive review of systems was negative.   Objective:   BP 134/78  Pulse 63  Temp(Src) 98.1 F (36.7 C) (Oral)  Resp 14  Ht 5\' 1"  (1.549 m)  Wt 146 lb 4 oz (66.339 kg)  BMI 27.65 kg/m2  SpO2 99%  General Appearance:    Alert, cooperative, no distress, appears stated age  Head:    Normocephalic, without obvious abnormality, atraumatic  Eyes:    PERRL, conjunctiva/corneas clear, EOM's intact, fundi    benign, both eyes  Ears:    Normal TM's and external ear canals, both ears  Nose:   Nares normal, septum midline, mucosa normal, no drainage    or sinus tenderness  Throat:   Lips, mucosa, and tongue normal; teeth and gums normal  Neck:   Supple, symmetrical,  trachea midline, no adenopathy;    thyroid:  no enlargement/tenderness/nodules; no carotid   bruit or JVD  Back:     Symmetric, no curvature, ROM normal, no CVA tenderness  Lungs:     Clear to auscultation bilaterally, respirations unlabored  Chest Wall:    No tenderness or deformity   Heart:    Regular rate and rhythm, S1 and S2 normal, no murmur, rub   or gallop     Abdomen:     Soft, non-tender, bowel sounds active all four quadrants,    no masses, no organomegaly  Extremities:   Extremities normal, atraumatic, no cyanosis or edema  Pulses:   2+ and symmetric all extremities  Skin:   Skin color, texture, turgor normal, no rashes or lesions  Lymph nodes:   Cervical, supraclavicular, and axillary nodes normal  Neurologic:   CNII-XII intact, normal strength, sensation and reflexes    throughout     Assessment:   Diffuse cystic mastopathy Annual breast exams and mammograms reviewed by Dr. Evette Cristal  Hypertension Well controlled on current regimen. Renal function stable, no changes today.  Overweight (BMI 25.0-29.9) I have addressed  BMI and recommended a low glycemic index diet utilizing smaller more frequent meals to increase  metabolism.  I have also recommended that patient start exercising with a goal of 30 minutes of aerobic exercise a minimum of 5 days per week. Screening for lipid disorders, thyroid and diabetes to be done today.    Hyperlipidemia  New ACC guidelines recommend starting patients aged 38 or higher on moderate intensity statin therapy for LDL between 70-189 and 10 yr risk of CAD > 7.5% ;  and high intensity therapy for anyone with LDL > 190.   Her LDL is > 130 and < 160 , she has hypertension and her father died of an acute MI at age 49.  Will recommended trial of simvastatin.     Unspecified hypothyroidism Thyroid function is WNL on current dose.  No current changes needed.    Updated Medication List Outpatient Encounter Prescriptions as of 09/18/2012   Medication Sig Dispense Refill  . levothyroxine (SYNTHROID, LEVOTHROID) 75 MCG tablet Take 75 mcg by mouth daily.      Marland Kitchen triamterene-hydrochlorothiazide (MAXZIDE-25) 37.5-25 MG per tablet TAKE 1 TABLET BY MOUTH DAILY.  90 tablet  0   No facility-administered encounter medications on file as of 09/18/2012.

## 2012-09-20 ENCOUNTER — Telehealth: Payer: Self-pay | Admitting: Internal Medicine

## 2012-09-20 ENCOUNTER — Encounter: Payer: Self-pay | Admitting: Internal Medicine

## 2012-09-20 DIAGNOSIS — E785 Hyperlipidemia, unspecified: Secondary | ICD-10-CM

## 2012-09-20 NOTE — Assessment & Plan Note (Signed)
I have addressed  BMI and recommended a low glycemic index diet utilizing smaller more frequent meals to increase metabolism.  I have also recommended that patient start exercising with a goal of 30 minutes of aerobic exercise a minimum of 5 days per week. Screening for lipid disorders, thyroid and diabetes to be done today.   

## 2012-09-20 NOTE — Telephone Encounter (Signed)
I have reviewed her labs.  Thyroid function is WNL on current dose.  No current changes needed.   Your untreated LDL (the bad cholesterol)  is elevated.  New ACC guidelines recommend starting patients aged 54 or higher on moderate intensity statin therapy for LDL between 70-189 and 10 yr risk of CAD > 7.5% ;   To modify your future risk for heart disease. Given your  Family history  And your personal history of hypertension, I do recommend a trial of generic zocor (simvastatin) .  The low glycemic index diet I gave you will help with weight loss  and lower  triglycerides,but it does not do much for LDL (only a more vegetarian diet will possibly lower the LDL)  If you are willing to start therapy I will send rx to pharmacy and you will need to return for repeat fasting labs and a hepatic panel in 6 weeks

## 2012-09-20 NOTE — Assessment & Plan Note (Signed)
Annual breast exams and mammograms reviewed by Dr. Evette Cristal

## 2012-09-20 NOTE — Assessment & Plan Note (Signed)
Thyroid function is WNL on current dose.  No current changes needed.  

## 2012-09-20 NOTE — Assessment & Plan Note (Addendum)
New ACC guidelines recommend starting patients aged 54 or higher on moderate intensity statin therapy for LDL between 70-189 and 10 yr risk of CAD > 7.5% ;  and high intensity therapy for anyone with LDL > 190.   Her LDL is > 130 and < 160 , she has hypertensionand her father died of an acute MI at age 27.  Will recommended trial of simvastatin.

## 2012-09-20 NOTE — Assessment & Plan Note (Signed)
Well controlled on current regimen. Renal function stable, no changes today. 

## 2012-09-21 ENCOUNTER — Encounter: Payer: Self-pay | Admitting: *Deleted

## 2012-09-22 MED ORDER — SIMVASTATIN 20 MG PO TABS: 20.0000 mg | ORAL_TABLET | Freq: Every evening | ORAL | Status: DC

## 2012-09-22 NOTE — Telephone Encounter (Signed)
rx sent,   Labs ordered,.  Minimum of 6 weeks,  Return for fasting labs.

## 2012-09-22 NOTE — Telephone Encounter (Signed)
Notified patient of results and patient stated she takes your recommendation and would like to start the simvastatin.

## 2012-09-24 NOTE — Telephone Encounter (Signed)
Patient notified and appointment made as requested for labs.

## 2012-10-01 ENCOUNTER — Other Ambulatory Visit: Payer: Self-pay | Admitting: *Deleted

## 2012-10-01 ENCOUNTER — Other Ambulatory Visit (INDEPENDENT_AMBULATORY_CARE_PROVIDER_SITE_OTHER): Payer: BC Managed Care – PPO

## 2012-10-01 DIAGNOSIS — Z1211 Encounter for screening for malignant neoplasm of colon: Secondary | ICD-10-CM

## 2012-10-02 ENCOUNTER — Encounter: Payer: Self-pay | Admitting: *Deleted

## 2012-10-18 LAB — FECAL OCCULT BLOOD, GUAIAC: FECAL OCCULT BLD: NEGATIVE

## 2012-10-30 ENCOUNTER — Other Ambulatory Visit (INDEPENDENT_AMBULATORY_CARE_PROVIDER_SITE_OTHER): Payer: BC Managed Care – PPO

## 2012-10-30 DIAGNOSIS — E785 Hyperlipidemia, unspecified: Secondary | ICD-10-CM

## 2012-10-30 LAB — COMPREHENSIVE METABOLIC PANEL
ALT: 19 U/L (ref 0–35)
Albumin: 4.4 g/dL (ref 3.5–5.2)
CO2: 30 mEq/L (ref 19–32)
GFR: 89.52 mL/min (ref >60.00)
Glucose, Bld: 79 mg/dL (ref 70–99)
Potassium: 3.9 mEq/L (ref 3.5–5.1)
Sodium: 137 mEq/L (ref 135–145)
Total Protein: 7.6 g/dL (ref 6.0–8.3)

## 2012-10-30 LAB — LIPID PANEL
Cholesterol: 142 mg/dL (ref 0–200)
LDL Cholesterol: 66 mg/dL (ref 0–99)

## 2012-11-04 ENCOUNTER — Encounter: Payer: Self-pay | Admitting: *Deleted

## 2012-12-07 ENCOUNTER — Other Ambulatory Visit: Payer: Self-pay | Admitting: Internal Medicine

## 2012-12-08 ENCOUNTER — Telehealth: Payer: Self-pay | Admitting: *Deleted

## 2012-12-08 NOTE — Telephone Encounter (Signed)
Patient called for refill follow up called patient left detailed message that medication has been called to pharmacy as requested.PER DPR.

## 2013-01-04 ENCOUNTER — Telehealth: Payer: Self-pay

## 2013-01-04 ENCOUNTER — Other Ambulatory Visit: Payer: Self-pay | Admitting: Internal Medicine

## 2013-01-04 NOTE — Telephone Encounter (Signed)
Would like to know if Dr. Evette Cristal could refill her Synthroid 0.075mg  for 90 days instead of 30 days.

## 2013-01-05 MED ORDER — LEVOTHYROXINE SODIUM 75 MCG PO TABS: 75.0000 ug | ORAL_TABLET | Freq: Every day | ORAL | Status: DC

## 2013-01-05 NOTE — Telephone Encounter (Signed)
I talked with the patient. She has recently had her labs done by Dr Darrick Huntsman. She states Dr. Evette Cristal orders the Synthroid and just wants a 3 months at a time. 90 day supply sent to pharmacy.

## 2013-06-08 ENCOUNTER — Ambulatory Visit: Payer: BC Managed Care – PPO | Admitting: General Surgery

## 2013-06-21 ENCOUNTER — Ambulatory Visit: Payer: Self-pay | Admitting: General Surgery

## 2013-06-28 ENCOUNTER — Encounter: Payer: Self-pay | Admitting: General Surgery

## 2013-07-05 ENCOUNTER — Ambulatory Visit (INDEPENDENT_AMBULATORY_CARE_PROVIDER_SITE_OTHER): Payer: BC Managed Care – PPO | Admitting: General Surgery

## 2013-07-05 ENCOUNTER — Encounter: Payer: Self-pay | Admitting: General Surgery

## 2013-07-05 VITALS — BP 124/76 | HR 76 | Resp 12 | Ht 61.0 in | Wt 150.0 lb

## 2013-07-05 DIAGNOSIS — Z1231 Encounter for screening mammogram for malignant neoplasm of breast: Secondary | ICD-10-CM

## 2013-07-05 NOTE — Patient Instructions (Signed)
Patient to return in one year bilateral screening mammogram .Continue self breast exams. Call office for any new breast issues or concerns.  

## 2013-07-05 NOTE — Progress Notes (Signed)
Patient ID: Kaitlyn Cortez, female   DOB: 08-05-1960, 53 y.o.   MRN: [id number]  Chief Complaint  Patient presents with  . Follow-up    mammogram    HPI Kaitlyn Cortez is a 53 y.o. female who presents for a breast evaluation. The most recent mammogram was done on 06/21/13 Patient does perform regular self breast checks and gets regular mammograms done.    HPI  Past Medical History  Diagnosis Date  . Menopausal menorrhagia 2009    hysterosocopy was normal,  menses ended 2009  . Overweight (BMI 25.0-29.9)   . Hyperlipidemia   . hypothyroidism   . Hypertension   . Anemia   . Diffuse cystic mastopathy 2011  . Unspecified disorder of thyroid 2011    Past Surgical History  Procedure Laterality Date  . Biopsy thyroid  2010    normal, Jamal Collin  takes low dose synthroid for suppression  . Tubal ligation    . Breast biopsy  2011    fibrocystic breast disease  . Hysteroscopy  2009  . Colonoscopy  2011    Family History  Problem Relation Age of Onset  . Heart disease Father   . Heart disease Maternal Uncle   . Depression Daughter     Social History History  Substance Use Topics  . Smoking status: Never Smoker   . Smokeless tobacco: Never Used  . Alcohol Use: Yes    No Known Allergies  Current Outpatient Prescriptions  Medication Sig Dispense Refill  . levothyroxine (SYNTHROID, LEVOTHROID) 75 MCG tablet Take 1 tablet (75 mcg total) by mouth daily.  90 tablet  4  . simvastatin (ZOCOR) 20 MG tablet TAKE 1 TABLET (20 MG TOTAL) BY MOUTH EVERY EVENING.  90 tablet  1  . triamterene-hydrochlorothiazide (MAXZIDE-25) 37.5-25 MG per tablet TAKE 1 TABLET BY MOUTH DAILY.  90 tablet  1   No current facility-administered medications for this visit.    Review of Systems Review of Systems  Constitutional: Negative.   Respiratory: Negative.   Cardiovascular: Negative.     Blood pressure 124/76, pulse 76, resp. rate 12, height 5\' 1"  (1.549 m), weight 150 lb (68.04 kg).  Physical  Exam Physical Exam  Constitutional: She is oriented to person, place, and time. She appears well-developed and well-nourished.  Eyes: Conjunctivae are normal. No scleral icterus.  Neck: Neck supple.  Cardiovascular: Normal rate, regular rhythm and normal heart sounds.   Pulmonary/Chest: Effort normal and breath sounds normal. Right breast exhibits no inverted nipple, no mass, no nipple discharge, no skin change and no tenderness. Left breast exhibits no inverted nipple, no mass, no nipple discharge, no skin change and no tenderness.  Abdominal: Soft. Bowel sounds are normal. There is no tenderness.  Lymphadenopathy:    She has no cervical adenopathy.    She has no axillary adenopathy.  Neurological: She is alert and oriented to person, place, and time.  Skin: Skin is warm and dry.    Data Reviewed Mammogram reviewed   Assessment   Stable exam, History of breast cysts.     Plan    Patient to return in one year bilateral screening mammogram.        Seeplaputhur G Sankar 07/05/2013, 1:25 PM

## 2013-09-09 ENCOUNTER — Ambulatory Visit (INDEPENDENT_AMBULATORY_CARE_PROVIDER_SITE_OTHER): Payer: BC Managed Care – PPO | Admitting: Adult Health

## 2013-09-09 ENCOUNTER — Encounter: Payer: Self-pay | Admitting: Adult Health

## 2013-09-09 VITALS — BP 141/90 | HR 71 | Temp 98.1°F | Resp 14 | Ht 61.0 in | Wt 148.5 lb

## 2013-09-09 DIAGNOSIS — R5381 Other malaise: Secondary | ICD-10-CM

## 2013-09-09 DIAGNOSIS — R5383 Other fatigue: Secondary | ICD-10-CM

## 2013-09-09 MED ORDER — AZITHROMYCIN 250 MG PO TABS: ORAL_TABLET | ORAL | Status: DC

## 2013-09-09 NOTE — Progress Notes (Signed)
Pre visit review using our clinic review tool, if applicable. No additional management support is needed unless otherwise documented below in the visit note. 

## 2013-09-09 NOTE — Progress Notes (Signed)
Patient ID: Kaitlyn Cortez, female   DOB: [birth year], 53 y.o.   MRN: [id number]   Subjective:    Patient ID: Kaitlyn Cortez, female    DOB: [birth year], 53 y.o.   MRN: [id number]  HPI  Presents to clinic with episode of feeling she was getting sick. Was in the hospital in [place] over the weekend for the birth of her grandchild. She reports feeling a sore throat coming on, felt a little achy and reports having a throbbing pain in the posterior right side of her head which was alleviated by Advil. She has not taken any other medication. Denies visual disturbances, syncope or near syncope, worse HA of her life, dizziness. She denies currently having any symptoms. Denies tick bites, insect bites.  Past Medical History  Diagnosis Date  . Menopausal menorrhagia 2009    hysterosocopy was normal,  menses ended 2009  . Overweight (BMI 25.0-29.9)   . Hyperlipidemia   . hypothyroidism   . Hypertension   . Anemia   . Diffuse cystic mastopathy 2011  . Unspecified disorder of thyroid 2011    Current Outpatient Prescriptions on File Prior to Visit  Medication Sig Dispense Refill  . levothyroxine (SYNTHROID, LEVOTHROID) 75 MCG tablet Take 1 tablet (75 mcg total) by mouth daily.  90 tablet  4  . simvastatin (ZOCOR) 20 MG tablet TAKE 1 TABLET (20 MG TOTAL) BY MOUTH EVERY EVENING.  90 tablet  1  . triamterene-hydrochlorothiazide (MAXZIDE-25) 37.5-25 MG per tablet TAKE 1 TABLET BY MOUTH DAILY.  90 tablet  1   No current facility-administered medications on file prior to visit.     Review of Systems Positive for achiness, throat discomfort, throbbing intermittent pain in posterior right side of head Negative for dizziness, visual disturbances, fever, chills, worse HA of her life, syncope or near syncope     Objective:  BP 141/90  Pulse 71  Temp(Src) 98.1 F (36.7 C) (Oral)  Resp 14  Wt 148 lb 8 oz (67.359 kg)  SpO2 99%   Physical Exam  Constitutional: She is oriented to person,  place, and time. No distress.  HENT:  Head: Normocephalic and atraumatic.  Left Ear: External ear normal.  Right ear canal is slightly irritated and erythematous. No s/s of infection. Pharynx is slightly erythematous as well.  Eyes: Conjunctivae and EOM are normal.  Cardiovascular: Normal rate and regular rhythm.   Pulmonary/Chest: Effort normal. No respiratory distress.  Musculoskeletal: Normal range of motion. She exhibits no edema and no tenderness.  Full ROM cervical spine  Lymphadenopathy:    She has cervical adenopathy (right anterior cervical adenopathy).  Neurological: She is alert and oriented to person, place, and time. No cranial nerve deficit.  Skin: Skin is warm and dry.  Psychiatric: She has a normal mood and affect. Her behavior is normal. Judgment and thought content normal.      Assessment & Plan:   1. Malaise Pt feeling achy and with malaise. May have come in contact with virus while at the hospital this past weekend. We discussed allowing her body to fight this off. Provided with antibiotic in the event symptoms worsen. She does not have a HA. Has only had intermittent throbbing sensation in the back of the right side of her head. Inflammatory response to illness may be contributing to this. Instructions on taking Advil for the next 4 days then prn. Drink fluids. Take antibiotic only if worsening of symptoms. Ice to her head. Instructed to go to the ED  for the "worse HA of her life" which she has not had.

## 2013-09-09 NOTE — Patient Instructions (Signed)
  I have provided you with prescription for Azithromycin. Take only if you develop a fever greater than 101, chills, ear pain, achy body feeling.  Drink plenty of fluids to stay hydrated.  Take Advil 2 tablets 3 times a day for the next 4 days then take only as needed.  You can try applying some ice to the back of your head for 15 min. Do this 2-3 times a day.

## 2013-09-10 ENCOUNTER — Ambulatory Visit: Payer: BC Managed Care – PPO | Admitting: Adult Health

## 2013-09-13 ENCOUNTER — Telehealth: Payer: Self-pay | Admitting: *Deleted

## 2013-09-13 MED ORDER — AMOXICILLIN-POT CLAVULANATE 875-125 MG PO TABS: 1.0000 | ORAL_TABLET | Freq: Two times a day (BID) | ORAL | Status: DC

## 2013-09-13 NOTE — Telephone Encounter (Signed)
Patient notified of directions and scheduled appointment for Wednesday.

## 2013-09-13 NOTE — Telephone Encounter (Signed)
I cannot see her utnil Wednesday,  Have her change the abxfrom azithromcyin to augmentin twice daily,  Take a probiotic daily for two weeks ,  And add otc sudafed 10 mg every 6 hours until 4 pm for the congestion.

## 2013-09-13 NOTE — Telephone Encounter (Signed)
Pt called states she was seen on Thurs by Raquel.  She states she continues to have headaches.  She is requesting to be seen today by Dr Derrel Nip.

## 2013-09-13 NOTE — Telephone Encounter (Signed)
Patient sat Kaitlyn Cortez on 09/09/13 General Malaise with headache, patient stated headaches has continued since that date with pulsating pain Advil does relief but has ear pain with this and achy. Patient started the azithro on Saturday. Patient is afebrile, at this time and has remained so. You have a held spot at 2.30 please advise can I use patient wanting to be seen by MD today.

## 2013-09-15 ENCOUNTER — Ambulatory Visit: Payer: BC Managed Care – PPO | Admitting: Internal Medicine

## 2013-09-17 ENCOUNTER — Telehealth: Payer: Self-pay | Admitting: Internal Medicine

## 2013-09-17 NOTE — Telephone Encounter (Signed)
Patient Information:  Caller Name: Leaann  Phone: [phone]  Patient: Kaitlyn Cortez, Kaitlyn Cortez  Gender: Female  DOB: 20-Mar-1960  Age: 53 Years  PCP: Deborra Medina (Adults only)  Pregnant: No  Office Follow Up:  Does the office need to follow up with this patient?: No  Instructions For The Office: N/A  RN Note:  Pt. states her Rt. ear has improved, but now the left ear is hurting. Advised the Augmentin should cover the Left. ear and to continue the medication. If has fever, will need to call and be seen at an UC.  Symptoms  Reason For Call & Symptoms: Went into the office to see Raquel Rey for her Rt. ear. Gave her a ZPack but advised not to take it unless she needed. Started it on 09/11/13 but it did not help and ear worsened. Changed to Augmentin 875mg . BID on 09/13/13. Pt. is also taking Sudafed. States Rt. ear has improved but now the left one hurts.  Reviewed Health History In EMR: Yes  Reviewed Medications In EMR: Yes  Reviewed Allergies In EMR: Yes  Reviewed Surgeries / Procedures: Yes  Date of Onset of Symptoms: 09/09/2013 OB / GYN:  LMP: Unknown  Guideline(s) Used:  Earache  Ear - Congestion  No Protocol Available - Sick Adult  Disposition Per Guideline:   Home Care  Reason For Disposition Reached:   Patient's symptoms are safe to treat at home per nursing judgment  Advice Given:  Call Back If:  New symptoms develop  You become worse.  Patient Will Follow Care Advice:  YES

## 2013-09-20 ENCOUNTER — Telehealth: Payer: Self-pay | Admitting: *Deleted

## 2013-09-20 NOTE — Telephone Encounter (Signed)
Pt called states she was seen in the ER in [place] on Saturday 8.1.15.  She states she was diagnosed with inner ear and advised to see ENT.  Pt further states when she called ENT for appoint the appoints were not until the end of the month.

## 2013-09-29 LAB — HM PAP SMEAR: HM PAP: NORMAL

## 2013-10-11 ENCOUNTER — Other Ambulatory Visit: Payer: Self-pay | Admitting: Internal Medicine

## 2013-10-11 DIAGNOSIS — E785 Hyperlipidemia, unspecified: Secondary | ICD-10-CM

## 2013-10-11 DIAGNOSIS — Z79899 Other long term (current) drug therapy: Secondary | ICD-10-CM

## 2013-10-11 DIAGNOSIS — R5383 Other fatigue: Secondary | ICD-10-CM

## 2013-10-11 DIAGNOSIS — R5381 Other malaise: Secondary | ICD-10-CM

## 2013-10-11 NOTE — Telephone Encounter (Addendum)
Please advise ok to fill until appointment schedule for 10/18/13? Patient also stated she had left voicemail on Friday for refill on medication. Patient last lipid 9/14.

## 2013-10-11 NOTE — Telephone Encounter (Signed)
If she will come in for fasting labs prior to appt, I will refill.  Simvastatin requires q 6 month liver tests for safety issue,s  And she is overdue

## 2013-10-18 ENCOUNTER — Ambulatory Visit (INDEPENDENT_AMBULATORY_CARE_PROVIDER_SITE_OTHER): Payer: BC Managed Care – PPO | Admitting: Internal Medicine

## 2013-10-18 ENCOUNTER — Encounter: Payer: Self-pay | Admitting: Internal Medicine

## 2013-10-18 ENCOUNTER — Other Ambulatory Visit (HOSPITAL_COMMUNITY)
Admission: RE | Admit: 2013-10-18 | Discharge: 2013-10-18 | Disposition: A | Discharge status: Home / Self Care | Payer: BC Managed Care – PPO | Source: Ambulatory Visit | Attending: Internal Medicine | Admitting: Internal Medicine

## 2013-10-18 VITALS — BP 118/66 | HR 72 | Temp 98.3°F | Resp 16 | Ht 61.75 in | Wt 142.5 lb

## 2013-10-18 DIAGNOSIS — Z1151 Encounter for screening for human papillomavirus (HPV): Secondary | ICD-10-CM | POA: Diagnosis present

## 2013-10-18 DIAGNOSIS — Z124 Encounter for screening for malignant neoplasm of cervix: Secondary | ICD-10-CM

## 2013-10-18 DIAGNOSIS — R5381 Other malaise: Secondary | ICD-10-CM

## 2013-10-18 DIAGNOSIS — Z01419 Encounter for gynecological examination (general) (routine) without abnormal findings: Secondary | ICD-10-CM | POA: Insufficient documentation

## 2013-10-18 DIAGNOSIS — R5383 Other fatigue: Secondary | ICD-10-CM

## 2013-10-18 DIAGNOSIS — E785 Hyperlipidemia, unspecified: Secondary | ICD-10-CM

## 2013-10-18 DIAGNOSIS — I10 Essential (primary) hypertension: Secondary | ICD-10-CM

## 2013-10-18 DIAGNOSIS — N6019 Diffuse cystic mastopathy of unspecified breast: Secondary | ICD-10-CM

## 2013-10-18 DIAGNOSIS — Z79899 Other long term (current) drug therapy: Secondary | ICD-10-CM

## 2013-10-18 LAB — CBC WITH DIFFERENTIAL/PLATELET
BASOS PCT: 0.9 % (ref 0.0–3.0)
Basophils Absolute: 0.1 10*3/uL (ref 0.0–0.1)
EOS ABS: 0.2 10*3/uL (ref 0.0–0.7)
Eosinophils Relative: 2.4 % (ref 0.0–5.0)
HCT: 39.5 % (ref 36.0–46.0)
HEMOGLOBIN: 13.6 g/dL (ref 12.0–15.0)
LYMPHS PCT: 41.3 % (ref 12.0–46.0)
Lymphs Abs: 2.8 10*3/uL (ref 0.7–4.0)
MCHC: 34.5 g/dL (ref 30.0–36.0)
MCV: 90.8 fl (ref 78.0–100.0)
Monocytes Absolute: 0.5 10*3/uL (ref 0.1–1.0)
Monocytes Relative: 7.2 % (ref 3.0–12.0)
NEUTROS ABS: 3.3 10*3/uL (ref 1.4–7.7)
NEUTROS PCT: 48.2 % (ref 43.0–77.0)
Platelets: 281 10*3/uL (ref 150.0–400.0)
RBC: 4.35 Mil/uL (ref 3.87–5.11)
RDW: 12.9 % (ref 11.5–15.5)
WBC: 6.8 10*3/uL (ref 4.0–10.5)

## 2013-10-18 LAB — COMPREHENSIVE METABOLIC PANEL
ALK PHOS: 60 U/L (ref 39–117)
ALT: 18 U/L (ref 0–35)
AST: 19 U/L (ref 0–37)
Albumin: 4.1 g/dL (ref 3.5–5.2)
BUN: 13 mg/dL (ref 6–23)
CALCIUM: 9.6 mg/dL (ref 8.4–10.5)
CO2: 25 mEq/L (ref 19–32)
CREATININE: 0.8 mg/dL (ref 0.4–1.2)
Chloride: 104 mEq/L (ref 96–112)
GFR: 81.33 mL/min (ref >60.00)
GLUCOSE: 103 mg/dL — AB (ref 70–99)
Potassium: 3.9 mEq/L (ref 3.5–5.1)
Sodium: 138 mEq/L (ref 135–145)
Total Bilirubin: 0.8 mg/dL (ref 0.2–1.2)
Total Protein: 7.2 g/dL (ref 6.0–8.3)

## 2013-10-18 LAB — LIPID PANEL
CHOL/HDL RATIO: 5
Cholesterol: 159 mg/dL (ref 0–200)
HDL: 33.2 mg/dL — AB (ref >39.00)
NonHDL: 125.8
TRIGLYCERIDES: 308 mg/dL — AB (ref 0.0–149.0)
VLDL: 61.6 mg/dL — ABNORMAL HIGH (ref 0.0–40.0)

## 2013-10-18 LAB — TSH: TSH: 1.08 u[IU]/mL (ref 0.35–4.50)

## 2013-10-18 NOTE — Assessment & Plan Note (Signed)
Managed by Dr Jamal Collin . Brest exam and annual mammo done by him/.

## 2013-10-18 NOTE — Assessment & Plan Note (Signed)
Managed with low dose zocor.,  She is overdue for fasting labs and CMEt which were finally drawn today but has only been fasitng for 3 hours.

## 2013-10-18 NOTE — Progress Notes (Signed)
Pre-visit discussion using our clinic review tool. No additional management support is needed unless otherwise documented below in the visit note.  

## 2013-10-18 NOTE — Patient Instructions (Signed)
You had your annual  wellness exam today.  We will repeat your PAP smear in 2018, sooner if needed   We will contact you with the bloodwork results  Health Maintenance Adopting a healthy lifestyle and getting preventive care can go a long way to promote health and wellness. Talk with your health care provider about what schedule of regular examinations is right for you. This is a good chance for you to check in with your provider about disease prevention and staying healthy. In between checkups, there are plenty of things you can do on your own. Experts have done a lot of research about which lifestyle changes and preventive measures are most likely to keep you healthy. Ask your health care provider for more information. WEIGHT AND DIET  Eat a healthy diet  Be sure to include plenty of vegetables, fruits, low-fat dairy products, and lean protein.  Do not eat a lot of foods high in solid fats, added sugars, or salt.  Get regular exercise. This is one of the most important things you can do for your health.  Most adults should exercise for at least 150 minutes each week. The exercise should increase your heart rate and make you sweat (moderate-intensity exercise).  Most adults should also do strengthening exercises at least twice a week. This is in addition to the moderate-intensity exercise.  Maintain a healthy weight  Body mass index (BMI) is a measurement that can be used to identify possible weight problems. It estimates body fat based on height and weight. Your health care provider can help determine your BMI and help you achieve or maintain a healthy weight.  For females 75 years of age and older:   A BMI below 18.5 is considered underweight.  A BMI of 18.5 to 24.9 is normal.  A BMI of 25 to 29.9 is considered overweight.  A BMI of 30 and above is considered obese.  Watch levels of cholesterol and blood lipids  You should start having your blood tested for lipids and  cholesterol at 55 years of age, then have this test every 5 years.  You may need to have your cholesterol levels checked more often if:  Your lipid or cholesterol levels are high.  You are older than 55 years of age.  You are at high risk for heart disease.  CANCER SCREENING   Lung Cancer  Lung cancer screening is recommended for adults 88-54 years old who are at high risk for lung cancer because of a history of smoking.  A yearly low-dose CT scan of the lungs is recommended for people who:  Currently smoke.  Have quit within the past 15 years.  Have at least a 30-pack-year history of smoking. A pack year is smoking an average of one pack of cigarettes a day for 1 year.  Yearly screening should continue until it has been 15 years since you quit.  Yearly screening should stop if you develop a health problem that would prevent you from having lung cancer treatment.  Breast Cancer  Practice breast self-awareness. This means understanding how your breasts normally appear and feel.  It also means doing regular breast self-exams. Let your health care provider know about any changes, no matter how small.  If you are in your 20s or 30s, you should have a clinical breast exam (CBE) by a health care provider every 1-3 years as part of a regular health exam.  If you are 60 or older, have a CBE every year. Also  consider having a breast X-ray (mammogram) every year.  If you have a family history of breast cancer, talk to your health care provider about genetic screening.  If you are at high risk for breast cancer, talk to your health care provider about having an MRI and a mammogram every year.  Breast cancer gene (BRCA) assessment is recommended for women who have family members with BRCA-related cancers. BRCA-related cancers include:  Breast.  Ovarian.  Tubal.  Peritoneal cancers.  Results of the assessment will determine the need for genetic counseling and BRCA1 and BRCA2  testing. Cervical Cancer Routine pelvic examinations to screen for cervical cancer are no longer recommended for nonpregnant women who are considered low risk for cancer of the pelvic organs (ovaries, uterus, and vagina) and who do not have symptoms. A pelvic examination may be necessary if you have symptoms including those associated with pelvic infections. Ask your health care provider if a screening pelvic exam is right for you.   The Pap test is the screening test for cervical cancer for women who are considered at risk.  If you had a hysterectomy for a problem that was not cancer or a condition that could lead to cancer, then you no longer need Pap tests.  If you are older than 65 years, and you have had normal Pap tests for the past 10 years, you no longer need to have Pap tests.  If you have had past treatment for cervical cancer or a condition that could lead to cancer, you need Pap tests and screening for cancer for at least 20 years after your treatment.  If you no longer get a Pap test, assess your risk factors if they change (such as having a new sexual partner). This can affect whether you should start being screened again.  Some women have medical problems that increase their chance of getting cervical cancer. If this is the case for you, your health care provider may recommend more frequent screening and Pap tests.  The human papillomavirus (HPV) test is another test that may be used for cervical cancer screening. The HPV test looks for the virus that can cause cell changes in the cervix. The cells collected during the Pap test can be tested for HPV.  The HPV test can be used to screen women 14 years of age and older. Getting tested for HPV can extend the interval between normal Pap tests from three to five years.  An HPV test also should be used to screen women of any age who have unclear Pap test results.  After 55 years of age, women should have HPV testing as often as Pap  tests.  Colorectal Cancer  This type of cancer can be detected and often prevented.  Routine colorectal cancer screening usually begins at 55 years of age and continues through 55 years of age.  Your health care provider may recommend screening at an earlier age if you have risk factors for colon cancer.  Your health care provider may also recommend using home test kits to check for hidden blood in the stool.  A small camera at the end of a tube can be used to examine your colon directly (sigmoidoscopy or colonoscopy). This is done to check for the earliest forms of colorectal cancer.  Routine screening usually begins at age 89.  Direct examination of the colon should be repeated every 5-10 years through 55 years of age. However, you may need to be screened more often if early forms of  precancerous polyps or small growths are found. Skin Cancer  Check your skin from head to toe regularly.  Tell your health care provider about any new moles or changes in moles, especially if there is a change in a mole's shape or color.  Also tell your health care provider if you have a mole that is larger than the size of a pencil eraser.  Always use sunscreen. Apply sunscreen liberally and repeatedly throughout the day.  Protect yourself by wearing long sleeves, pants, a wide-brimmed hat, and sunglasses whenever you are outside. HEART DISEASE, DIABETES, AND HIGH BLOOD PRESSURE   Have your blood pressure checked at least every 1-2 years. High blood pressure causes heart disease and increases the risk of stroke.  If you are between 53 years and 72 years old, ask your health care provider if you should take aspirin to prevent strokes.  Have regular diabetes screenings. This involves taking a blood sample to check your fasting blood sugar level.  If you are at a normal weight and have a low risk for diabetes, have this test once every three years after 55 years of age.  If you are overweight and  have a high risk for diabetes, consider being tested at a younger age or more often. PREVENTING INFECTION  Hepatitis B  If you have a higher risk for hepatitis B, you should be screened for this virus. You are considered at high risk for hepatitis B if:  You were born in a country where hepatitis B is common. Ask your health care provider which countries are considered high risk.  Your parents were born in a high-risk country, and you have not been immunized against hepatitis B (hepatitis B vaccine).  You have HIV or AIDS.  You use needles to inject street drugs.  You live with someone who has hepatitis B.  You have had sex with someone who has hepatitis B.  You get hemodialysis treatment.  You take certain medicines for conditions, including cancer, organ transplantation, and autoimmune conditions. Hepatitis C  Blood testing is recommended for:  Everyone born from [birth year] through 1967.  Anyone with known risk factors for hepatitis C. Sexually transmitted infections (STIs)  You should be screened for sexually transmitted infections (STIs) including gonorrhea and chlamydia if:  You are sexually active and are younger than 55 years of age.  You are older than 55 years of age and your health care provider tells you that you are at risk for this type of infection.  Your sexual activity has changed since you were last screened and you are at an increased risk for chlamydia or gonorrhea. Ask your health care provider if you are at risk.  If you do not have HIV, but are at risk, it may be recommended that you take a prescription medicine daily to prevent HIV infection. This is called pre-exposure prophylaxis (PrEP). You are considered at risk if:  You are sexually active and do not regularly use condoms or know the HIV status of your partner(s).  You take drugs by injection.  You are sexually active with a partner who has HIV. Talk with your health care provider about whether you  are at high risk of being infected with HIV. If you choose to begin PrEP, you should first be tested for HIV. You should then be tested every 3 months for as long as you are taking PrEP.  PREGNANCY   If you are premenopausal and you may become pregnant, ask your health care  provider about preconception counseling.  If you may become pregnant, take 400 to 800 micrograms (mcg) of folic acid every day.  If you want to prevent pregnancy, talk to your health care provider about birth control (contraception). OSTEOPOROSIS AND MENOPAUSE   Osteoporosis is a disease in which the bones lose minerals and strength with aging. This can result in serious bone fractures. Your risk for osteoporosis can be identified using a bone density scan.  If you are 75 years of age or older, or if you are at risk for osteoporosis and fractures, ask your health care provider if you should be screened.  Ask your health care provider whether you should take a calcium or vitamin D supplement to lower your risk for osteoporosis.  Menopause may have certain physical symptoms and risks.  Hormone replacement therapy may reduce some of these symptoms and risks. Talk to your health care provider about whether hormone replacement therapy is right for you.  HOME CARE INSTRUCTIONS   Schedule regular health, dental, and eye exams.  Stay current with your immunizations.   Do not use any tobacco products including cigarettes, chewing tobacco, or electronic cigarettes.  If you are pregnant, do not drink alcohol.  If you are breastfeeding, limit how much and how often you drink alcohol.  Limit alcohol intake to no more than 1 drink per day for nonpregnant women. One drink equals 12 ounces of beer, 5 ounces of wine, or 1 ounces of hard liquor.  Do not use street drugs.  Do not share needles.  Ask your health care provider for help if you need support or information about quitting drugs.  Tell your health care provider if  you often feel depressed.  Tell your health care provider if you have ever been abused or do not feel safe at home. Document Released: 08/20/2010 Document Revised: 06/21/2013 Document Reviewed: 01/06/2013 [facility] Patient Information 2015 [place], [place]. This information is not intended to replace advice given to you by your health care provider. Make sure you discuss any questions you have with your health care provider.

## 2013-10-18 NOTE — Progress Notes (Signed)
Patient ID: EMONY Cortez, female   DOB: 26-Oct-1960, 53 y.o.   MRN: [id number]    Subjective:     Kaitlyn Cortez is a 53 y.o. female and is here for a comprehensive physical exam. The patient reports recent  Prolonged malaise.  She was treated by [facility] on July 23 for signs and symptons of a viral URI (throbbing head) and she started taking azithromycin despite absence of fever for persistent occipital  Headache.  Switched to augmentin when she developed ear pain without fever.    Then went to beach and developed vertigo with inability to walk.  Was treated in ER at [facility] with [facility] head,  zofran and valium which resolved the nausea  And vertigo.  Had no appetite for several weeks,  Lost 11 lbs , now gaining it back .  Saw ENT at [facility] clinic, hearing test was positive for bilateral hearing loss , saw some fluid behind one ear drum .   The nausea and anorexia finally resolved 7 days ago.   History   Social History  . Marital Status: Married    Spouse Name: N/A    Number of Children: N/A  . Years of Education: N/A   Occupational History  . Not on file.   Social History Main Topics  . Smoking status: Never Smoker   . Smokeless tobacco: Never Used  . Alcohol Use: Yes  . Drug Use: No  . Sexual Activity: Not on file   Other Topics Concern  . Not on file   Social History Narrative  . No narrative on file   Health Maintenance  Topic Date Due  . Influenza Vaccine  09/18/2013  . Pap Smear  02/08/2014  . Mammogram  06/22/2015  . Colonoscopy  07/14/2019  . Tetanus/tdap  09/19/2022    The following portions of the patient's history were reviewed and updated as appropriate: allergies, current medications, past family history, past medical history, past social history, past surgical history and problem list.  Review of Systems A comprehensive review of systems was negative.   Objective:   BP 118/66  Pulse 72  Temp(Src) 98.3 F (36.8 C) (Oral)  Resp 16  Ht 5' 1.75" (1.568 m)  Wt  142 lb 8 oz (64.638 kg)  BMI 26.29 kg/m2  SpO2 99%  General Appearance:    Alert, cooperative, no distress, appears stated age  Head:    Normocephalic, without obvious abnormality, atraumatic  Eyes:    PERRL, conjunctiva/corneas clear, EOM's intact, fundi    benign, both eyes  Ears:    Normal TM's and external ear canals, both ears  Nose:   Nares normal, septum midline, mucosa normal, no drainage    or sinus tenderness  Throat:   Lips, mucosa, and tongue normal; teeth and gums normal  Neck:   Supple, symmetrical, trachea midline, no adenopathy;    thyroid:  no enlargement/tenderness/nodules; no carotid   bruit or JVD  Back:     Symmetric, no curvature, ROM normal, no CVA tenderness  Lungs:     Clear to auscultation bilaterally, respirations unlabored  Chest Wall:    No tenderness or deformity   Heart:    Regular rate and rhythm, S1 and S2 normal, no murmur, rub   or gallop  Breast Exam:    No tenderness, masses, or nipple abnormality  Abdomen:     Soft, non-tender, bowel sounds active all four quadrants,    no masses, no organomegaly  Genitalia:    Pelvic:  cervix normal in appearance, external genitalia normal, no adnexal masses or tenderness, no cervical motion tenderness, rectovaginal septum normal, uterus normal size, shape, and consistency and vagina normal without discharge  Extremities:   Extremities normal, atraumatic, no cyanosis or edema  Pulses:   2+ and symmetric all extremities  Skin:   Skin color, texture, turgor normal, no rashes or lesions  Lymph nodes:   Cervical, supraclavicular, and axillary nodes normal  Neurologic:   CNII-XII intact, normal strength, sensation and reflexes    throughout     Assessment and plan:   Hypertension Well controlled on current regimen. Renal function is due , no changes today.  Diffuse cystic mastopathy Managed by Dr Jamal Collin . Brest exam and annual mammo done by him/.  Other malaise and fatigue She is recovering a prolonged  nonfebrile  illness that did not respond to broad spectrum antibiotics and was likely complicated by dehydration leading to vertigo. Long discussion today about her symptoms, treatment and possible etiologies.   Hyperlipidemia Managed with low dose zocor.,  She is overdue for fasting labs and CMEt which were finally drawn today but has only been fasitng for 3 hours.    Updated Medication List Outpatient Encounter Prescriptions as of 10/18/2013  Medication Sig  . levothyroxine (SYNTHROID, LEVOTHROID) 75 MCG tablet Take 1 tablet (75 mcg total) by mouth daily.  . simvastatin (ZOCOR) 20 MG tablet TAKE 1 TABLET (20 MG TOTAL) BY MOUTH EVERY EVENING.  Marland Kitchen triamterene-hydrochlorothiazide (MAXZIDE-25) 37.5-25 MG per tablet TAKE 1 TABLET BY MOUTH DAILY.  . [DISCONTINUED] amoxicillin-clavulanate (AUGMENTIN) 875-125 MG per tablet Take 1 tablet by mouth 2 (two) times daily.

## 2013-10-18 NOTE — Assessment & Plan Note (Signed)
She is recovering a prolonged nonfebrile  illness that did not respond to broad spectrum antibiotics and was likely complicated by dehydration leading to vertigo. Long discussion today about her symptoms, treatment and possible etiologies.

## 2013-10-18 NOTE — Assessment & Plan Note (Signed)
Well controlled on current regimen. Renal function is due, no changes today. °

## 2013-10-19 LAB — LDL CHOLESTEROL, DIRECT: Direct LDL: 100 mg/dL

## 2013-10-19 LAB — CYTOLOGY - PAP

## 2013-10-22 ENCOUNTER — Encounter: Payer: Self-pay | Admitting: *Deleted

## 2013-11-30 ENCOUNTER — Other Ambulatory Visit: Payer: Self-pay | Admitting: Internal Medicine

## 2013-12-20 ENCOUNTER — Encounter: Payer: Self-pay | Admitting: Internal Medicine

## 2014-03-08 ENCOUNTER — Other Ambulatory Visit: Payer: Self-pay | Admitting: Internal Medicine

## 2014-06-02 ENCOUNTER — Other Ambulatory Visit: Payer: Self-pay

## 2014-06-02 DIAGNOSIS — Z1231 Encounter for screening mammogram for malignant neoplasm of breast: Secondary | ICD-10-CM

## 2014-06-30 ENCOUNTER — Ambulatory Visit: Payer: Self-pay | Admitting: General Surgery

## 2014-07-04 ENCOUNTER — Encounter: Payer: Self-pay | Admitting: General Surgery

## 2014-07-04 ENCOUNTER — Ambulatory Visit (INDEPENDENT_AMBULATORY_CARE_PROVIDER_SITE_OTHER): Payer: BLUE CROSS/BLUE SHIELD | Admitting: General Surgery

## 2014-07-04 VITALS — BP 130/78 | HR 76 | Resp 12 | Ht 61.0 in | Wt 145.0 lb

## 2014-07-04 DIAGNOSIS — Z872 Personal history of diseases of the skin and subcutaneous tissue: Secondary | ICD-10-CM

## 2014-07-04 DIAGNOSIS — Z1231 Encounter for screening mammogram for malignant neoplasm of breast: Secondary | ICD-10-CM

## 2014-07-04 NOTE — Patient Instructions (Signed)
Patient will be asked to return to the office in one year with a bilateral screening mammogram.  Continue self breast exams. Call office for any new breast issues or concerns.  

## 2014-07-04 NOTE — Progress Notes (Signed)
Patient ID: Kaitlyn Cortez, female   DOB: 10/26/1960, 54 y.o.   MRN: [id number]  Chief Complaint  Patient presents with  . Follow-up    mammogram    HPI Kaitlyn Cortez is a 54 y.o. female who presents for a breast evaluation. The most recent mammogram was done on 06/24/14.Marland Kitchen  Patient does perform regular self breast checks and gets regular mammograms done.    HPI  Past Medical History  Diagnosis Date  . Menopausal menorrhagia 2009    hysterosocopy was normal,  menses ended 2009  . Overweight (BMI 25.0-29.9)   . Hyperlipidemia   . hypothyroidism   . Hypertension   . Anemia   . Diffuse cystic mastopathy 2011  . Unspecified disorder of thyroid 2011    Past Surgical History  Procedure Laterality Date  . Biopsy thyroid  2010    normal, Jamal Collin  takes low dose synthroid for suppression  . Tubal ligation    . Breast biopsy  2011    fibrocystic breast disease  . Hysteroscopy  2009  . Colonoscopy  2011    Family History  Problem Relation Age of Onset  . Heart disease Father   . Heart disease Maternal Uncle   . Depression Daughter     Social History History  Substance Use Topics  . Smoking status: Never Smoker   . Smokeless tobacco: Never Used  . Alcohol Use: Yes    No Known Allergies  Current Outpatient Prescriptions  Medication Sig Dispense Refill  . levothyroxine (SYNTHROID, LEVOTHROID) 75 MCG tablet Take 1 tablet (75 mcg total) by mouth daily. 90 tablet 4  . simvastatin (ZOCOR) 20 MG tablet TAKE 1 TABLET (20 MG TOTAL) BY MOUTH EVERY EVENING. 30 tablet 5  . triamterene-hydrochlorothiazide (MAXZIDE-25) 37.5-25 MG per tablet TAKE 1 TABLET BY MOUTH DAILY. 90 tablet 1   No current facility-administered medications for this visit.    Review of Systems Review of Systems  Constitutional: Negative.   Respiratory: Negative.   Cardiovascular: Negative.     Blood pressure 130/78, pulse 76, resp. rate 12, height 5\' 1"  (1.549 m), weight 145 lb (65.772 kg).  Physical  Exam Physical Exam  Constitutional: She is oriented to person, place, and time. She appears well-developed and well-nourished.  Eyes: Conjunctivae are normal. No scleral icterus.  Neck: Neck supple. Thyromegaly (mild, r/l. No nodules palpable) present.  Cardiovascular: Normal rate, regular rhythm and normal heart sounds.   Pulmonary/Chest: Effort normal and breath sounds normal. Right breast exhibits no inverted nipple, no mass, no nipple discharge, no skin change and no tenderness. Left breast exhibits no inverted nipple, no mass, no nipple discharge, no skin change and no tenderness.  Abdominal: Soft. Bowel sounds are normal. There is no tenderness.  Lymphadenopathy:    She has no cervical adenopathy.    She has no axillary adenopathy.  Neurological: She is alert and oriented to person, place, and time.  Skin: Skin is warm and dry.    Data Reviewed Mammogram reviewed    Assessment    Stable exam. History of breast cyst. Thyromegaly    Plan    The patient has been asked to return to the office in one year with a bilateral screening mammogram.     PCP:  Deirdre Pippins 07/04/2014, 1:02 PM

## 2014-09-20 ENCOUNTER — Other Ambulatory Visit: Payer: Self-pay | Admitting: Internal Medicine

## 2014-09-20 NOTE — Telephone Encounter (Signed)
Pt medication was refilled for 30 day supply until her appointment in September.

## 2014-10-21 ENCOUNTER — Encounter: Payer: BC Managed Care – PPO | Admitting: Internal Medicine

## 2014-10-31 ENCOUNTER — Ambulatory Visit (INDEPENDENT_AMBULATORY_CARE_PROVIDER_SITE_OTHER): Payer: Self-pay | Admitting: Internal Medicine

## 2014-10-31 ENCOUNTER — Encounter: Payer: Self-pay | Admitting: Internal Medicine

## 2014-10-31 VITALS — BP 126/78 | HR 69 | Temp 98.0°F | Resp 12 | Ht 60.75 in | Wt 148.0 lb

## 2014-10-31 DIAGNOSIS — E034 Atrophy of thyroid (acquired): Secondary | ICD-10-CM | POA: Diagnosis not present

## 2014-10-31 DIAGNOSIS — E038 Other specified hypothyroidism: Secondary | ICD-10-CM

## 2014-10-31 DIAGNOSIS — I10 Essential (primary) hypertension: Secondary | ICD-10-CM | POA: Diagnosis not present

## 2014-10-31 DIAGNOSIS — Z0000 Encounter for general adult medical examination without abnormal findings: Secondary | ICD-10-CM

## 2014-10-31 NOTE — Progress Notes (Signed)
Patient ID: Kaitlyn Cortez, female    DOB: 1960-05-01  Age: 54 y.o. MRN: [id number]  The patient is here for annual wellness examination and management of other chronic and acute problems.  No recurrence of vertigo since last year while in [place] which was treated with head CT,  Valium and meclizine   Reviewed prior experience which started with a throbbing  occipital  headache . Saw ENT    The risk factors are reflected in the social history.  The roster of all physicians providing medical care to patient - is listed in the Snapshot section of the chart.   Home safety : The patient has smoke detectors in the home. They wear seatbelts.  There are no firearms at home. There is no violence in the home.   There is no risks for hepatitis, STDs or HIV. There is no   history of blood transfusion. They have no travel history to infectious disease endemic areas of the world.  The patient has seen their dentist in the last six month. They have seen their eye doctor in the last year. They admit to slight hearing difficulty with regard to whispered voices and some television programs.  They have deferred audiologic testing in the last year.  They do not  have excessive sun exposure. Discussed the need for sun protection: hats, long sleeves and use of sunscreen if there is significant sun exposure.   Diet: the importance of a healthy diet is discussed. They do have a healthy diet.  The benefits of regular aerobic exercise were discussed. She walks 4 times per week ,  20 minutes.   Depression screen: there are no signs or vegative symptoms of depression- irritability, change in appetite, anhedonia, sadness/tearfullness.   The following portions of the patient's history were reviewed and updated as appropriate: allergies, current medications, past family history, past medical history,  past surgical history, past social history  and problem list.  Visual acuity was not assessed per patient  preference since she has regular follow up with her ophthalmologist. Hearing and body mass index were assessed and reviewed.   During the course of the visit the patient was educated and counseled about appropriate screening and preventive services including : fall prevention , diabetes screening, nutrition counseling, colorectal cancer screening, and recommended immunizations.    CC: The primary encounter diagnosis was Visit for preventive health examination. Diagnoses of Hypothyroidism due to acquired atrophy of thyroid and Essential hypertension were also pertinent to this visit.  History Kaitlyn Cortez has a past medical history of Menopausal menorrhagia (2009); Overweight (BMI 25.0-29.9); Hyperlipidemia; hypothyroidism; Hypertension; Anemia; Diffuse cystic mastopathy (2011); and Unspecified disorder of thyroid (2011).   She has past surgical history that includes Biopsy thyroid (2010); Tubal ligation; Breast biopsy (2011); Hysteroscopy (2009); and Colonoscopy (2011).   Her family history includes CAD (age of onset: 56) in her mother; Depression in her daughter; Heart disease in her maternal uncle; Heart disease (age of onset: 68) in her father.She reports that she has never smoked. She has never used smokeless tobacco. She reports that she drinks alcohol. She reports that she does not use illicit drugs.  Outpatient Prescriptions Prior to Visit  Medication Sig Dispense Refill  . levothyroxine (SYNTHROID, LEVOTHROID) 75 MCG tablet Take 1 tablet (75 mcg total) by mouth daily. 90 tablet 4  . simvastatin (ZOCOR) 20 MG tablet TAKE 1 TABLET (20 MG TOTAL) BY MOUTH EVERY EVENING. 30 tablet 0  . triamterene-hydrochlorothiazide (MAXZIDE-25) 37.5-25 MG per tablet TAKE 1  TABLET BY MOUTH DAILY. 90 tablet 1   No facility-administered medications prior to visit.    Review of Systems   Patient denies headache, fevers, malaise, unintentional weight loss, skin rash, eye pain, sinus congestion and sinus pain, sore  throat, dysphagia,  hemoptysis , cough, dyspnea, wheezing, chest pain, palpitations, orthopnea, edema, abdominal pain, nausea, melena, diarrhea, constipation, flank pain, dysuria, hematuria, urinary  Frequency, nocturia, numbness, tingling, seizures,  Focal weakness, Loss of consciousness,  Tremor, insomnia, depression, anxiety, and suicidal ideation.      Objective:  BP 126/78 mmHg  Pulse 69  Temp(Src) 98 F (36.7 C) (Oral)  Resp 12  Ht 5' 0.75" (1.543 m)  Wt 148 lb (67.132 kg)  BMI 28.20 kg/m2  SpO2 98%  Physical Exam   General appearance: alert, cooperative and appears stated age Head: Normocephalic, without obvious abnormality, atraumatic Eyes: conjunctivae/corneas clear. PERRL, EOM's intact. Fundi benign. Ears: normal TM's and external ear canals both ears Nose: Nares normal. Septum midline. Mucosa normal. No drainage or sinus tenderness. Throat: lips, mucosa, and tongue normal; teeth and gums normal Neck: no adenopathy, no carotid bruit, no JVD, supple, symmetrical, trachea midline and thyroid not enlarged, symmetric, no tenderness/mass/nodules Lungs: clear to auscultation bilaterally Breasts: normal appearance, no masses or tenderness Heart: regular rate and rhythm, S1, S2 normal, no murmur, click, rub or gallop Abdomen: soft, non-tender; bowel sounds normal; no masses,  no organomegaly Extremities: extremities normal, atraumatic, no cyanosis or edema Pulses: 2+ and symmetric Skin: Skin color, texture, turgor normal. No rashes or lesions Neurologic: Alert and oriented X 3, normal strength and tone. Normal symmetric reflexes. Normal coordination and gait.     Assessment & Plan:   Problem List Items Addressed This Visit      Unprioritized   Hypertension    Well controlled on current regimen. Renal function is overdue. no changes today.       Hypothyroidism    Thyroid function is WNL on current dose.  No current changes needed.   Lab Results  Component Value Date    TSH 1.08 10/18/2013         Visit for preventive health examination - Primary    Annual wellness  exam was done as well as a comprehensive physical exam  .  During the course of the visit the patient was educated and counseled about appropriate screening and preventive services and screenings were brought up to date for cervical and breast cancer .  She will return for fasting labs to provide samples for diabetes screening and lipid analysis with projected  10 year  risk for CAD. nutrition counseling, skin cancer screening has been recommended, along with review of the age appropriate recommended immunizations.  Printed recommendations for health maintenance screenings was given.           I am having Ms. Xin maintain her levothyroxine, triamterene-hydrochlorothiazide, and simvastatin.  No orders of the defined types were placed in this encounter.    There are no discontinued medications.  Follow-up: No Follow-up on file.   Crecencio Mc, MD

## 2014-10-31 NOTE — Patient Instructions (Signed)
Return for fasting labs  ASAP.  I  recommend getting the majority of your calcium and Vitamin D  through diet rather than supplements given the recent association of calcium supplements with increased coronary artery calcium scores.  Try the almond and cashew milks that most grocery stores  now carry  in the dairy  Section>   They are lactose free:  [employer] brand Almond Light,  Original formula.  Delicious,  Low carb,  Low cal,  Cholesterol free   Your goal weight of 128 lbs will lower your BMI to 24.2 There are MANY diets that will work, but they need to be lifestyle changes accompanied by aerobic exercise  5 days a week to achieve and maintain your goal weight.   The  diet I discussed with you today is the 10 day Green Smoothie Cleansing /Detox Diet by Linden Dolin . available on [employer] for around $10.  This is not a low carb or a weight loss diet,  It is fundamentally a "cleansing" low fat diet that eliminates sugar, gluten, caffeine, alcohol and dairy for 10 days .  What you add back after the initial ten days is entirely up to  you!  You can expect to lose 5 to 10 lbs depending on how strict you are.   I suggest drinking 2 smoothies daily and keeping one chewable meal (but keep it simple, like baked fish and salad, rice or bok choy) .  You snack primarily on fresh  fruit, egg whites and judicious quantities of nuts. You can add vegetable based protein powder (nothing with whey , since whey is dairy) in it.  [employer] has a [employer], political party .  It does require some form of a nutrient extractor (Vita Mix, a electric juicer,  Or a Nutribullet Rx).  i have found that using frozen fruits is much more convenient and cost effective. You can even find plenty of organic fruit in the frozen fruit section of [employer].  Just thaw what you need for the following day the night before in the refrigerator (to avoid jamming up your juicer/blender)   Health Maintenance Adopting a healthy lifestyle and getting preventive  care can go a long way to promote health and wellness. Talk with your health care provider about what schedule of regular examinations is right for you. This is a good chance for you to check in with your provider about disease prevention and staying healthy. In between checkups, there are plenty of things you can do on your own. Experts have done a lot of research about which lifestyle changes and preventive measures are most likely to keep you healthy. Ask your health care provider for more information. WEIGHT AND DIET  Eat a healthy diet  Be sure to include plenty of vegetables, fruits, low-fat dairy products, and lean protein.  Do not eat a lot of foods high in solid fats, added sugars, or salt.  Get regular exercise. This is one of the most important things you can do for your health.  Most adults should exercise for at least 150 minutes each week. The exercise should increase your heart rate and make you sweat (moderate-intensity exercise).  Most adults should also do strengthening exercises at least twice a week. This is in addition to the moderate-intensity exercise.  Maintain a healthy weight  Body mass index (BMI) is a measurement that can be used to identify possible weight problems. It estimates body fat based on height and weight. Your health care provider can help  determine your BMI and help you achieve or maintain a healthy weight.  For females 56 years of age and older:   A BMI below 18.5 is considered underweight.  A BMI of 18.5 to 24.9 is normal.  A BMI of 25 to 29.9 is considered overweight.  A BMI of 30 and above is considered obese.  Watch levels of cholesterol and blood lipids  You should start having your blood tested for lipids and cholesterol at 56 years of age, then have this test every 5 years.  You may need to have your cholesterol levels checked more often if:  Your lipid or cholesterol levels are high.  You are older than 56 years of age.  You are  at high risk for heart disease.  CANCER SCREENING   Lung Cancer  Lung cancer screening is recommended for adults 56-41 years old who are at high risk for lung cancer because of a history of smoking.  A yearly low-dose CT scan of the lungs is recommended for people who:  Currently smoke.  Have quit within the past 15 years.  Have at least a 30-pack-year history of smoking. A pack year is smoking an average of one pack of cigarettes a day for 1 year.  Yearly screening should continue until it has been 15 years since you quit.  Yearly screening should stop if you develop a health problem that would prevent you from having lung cancer treatment.  Breast Cancer  Practice breast self-awareness. This means understanding how your breasts normally appear and feel.  It also means doing regular breast self-exams. Let your health care provider know about any changes, no matter how small.  If you are in your 20s or 30s, you should have a clinical breast exam (CBE) by a health care provider every 1-3 years as part of a regular health exam.  If you are 56 or older, have a CBE every year. Also consider having a breast X-ray (mammogram) every year.  If you have a family history of breast cancer, talk to your health care provider about genetic screening.  If you are at high risk for breast cancer, talk to your health care provider about having an MRI and a mammogram every year.  Breast cancer gene (BRCA) assessment is recommended for women who have family members with BRCA-related cancers. BRCA-related cancers include:  Breast.  Ovarian.  Tubal.  Peritoneal cancers.  Results of the assessment will determine the need for genetic counseling and BRCA1 and BRCA2 testing. Cervical Cancer Routine pelvic examinations to screen for cervical cancer are no longer recommended for nonpregnant women who are considered low risk for cancer of the pelvic organs (ovaries, uterus, and vagina) and who do  not have symptoms. A pelvic examination may be necessary if you have symptoms including those associated with pelvic infections. Ask your health care provider if a screening pelvic exam is right for you.   The Pap test is the screening test for cervical cancer for women who are considered at risk.  If you had a hysterectomy for a problem that was not cancer or a condition that could lead to cancer, then you no longer need Pap tests.  If you are older than 65 years, and you have had normal Pap tests for the past 10 years, you no longer need to have Pap tests.  If you have had past treatment for cervical cancer or a condition that could lead to cancer, you need Pap tests and screening for cancer for  at least 20 years after your treatment.  If you no longer get a Pap test, assess your risk factors if they change (such as having a new sexual partner). This can affect whether you should start being screened again.  Some women have medical problems that increase their chance of getting cervical cancer. If this is the case for you, your health care provider may recommend more frequent screening and Pap tests.  The human papillomavirus (HPV) test is another test that may be used for cervical cancer screening. The HPV test looks for the virus that can cause cell changes in the cervix. The cells collected during the Pap test can be tested for HPV.  The HPV test can be used to screen women 36 years of age and older. Getting tested for HPV can extend the interval between normal Pap tests from three to five years.  An HPV test also should be used to screen women of any age who have unclear Pap test results.  After 56 years of age, women should have HPV testing as often as Pap tests.  Colorectal Cancer  This type of cancer can be detected and often prevented.  Routine colorectal cancer screening usually begins at 56 years of age and continues through 56 years of age.  Your health care provider may  recommend screening at an earlier age if you have risk factors for colon cancer.  Your health care provider may also recommend using home test kits to check for hidden blood in the stool.  A small camera at the end of a tube can be used to examine your colon directly (sigmoidoscopy or colonoscopy). This is done to check for the earliest forms of colorectal cancer.  Routine screening usually begins at age 40.  Direct examination of the colon should be repeated every 5-10 years through 56 years of age. However, you may need to be screened more often if early forms of precancerous polyps or small growths are found. Skin Cancer  Check your skin from head to toe regularly.  Tell your health care provider about any new moles or changes in moles, especially if there is a change in a mole's shape or color.  Also tell your health care provider if you have a mole that is larger than the size of a pencil eraser.  Always use sunscreen. Apply sunscreen liberally and repeatedly throughout the day.  Protect yourself by wearing long sleeves, pants, a wide-brimmed hat, and sunglasses whenever you are outside. HEART DISEASE, DIABETES, AND HIGH BLOOD PRESSURE   Have your blood pressure checked at least every 1-2 years. High blood pressure causes heart disease and increases the risk of stroke.  If you are between 45 years and 80 years old, ask your health care provider if you should take aspirin to prevent strokes.  Have regular diabetes screenings. This involves taking a blood sample to check your fasting blood sugar level.  If you are at a normal weight and have a low risk for diabetes, have this test once every three years after 56 years of age.  If you are overweight and have a high risk for diabetes, consider being tested at a younger age or more often. PREVENTING INFECTION  Hepatitis B  If you have a higher risk for hepatitis B, you should be screened for this virus. You are considered at high  risk for hepatitis B if:  You were born in a country where hepatitis B is common. Ask your health care provider which countries  are considered high risk.  Your parents were born in a high-risk country, and you have not been immunized against hepatitis B (hepatitis B vaccine).  You have HIV or AIDS.  You use needles to inject street drugs.  You live with someone who has hepatitis B.  You have had sex with someone who has hepatitis B.  You get hemodialysis treatment.  You take certain medicines for conditions, including cancer, organ transplantation, and autoimmune conditions. Hepatitis C  Blood testing is recommended for:  Everyone born from [birth year] through 1967.  Anyone with known risk factors for hepatitis C. Sexually transmitted infections (STIs)  You should be screened for sexually transmitted infections (STIs) including gonorrhea and chlamydia if:  You are sexually active and are younger than 56 years of age.  You are older than 56 years of age and your health care provider tells you that you are at risk for this type of infection.  Your sexual activity has changed since you were last screened and you are at an increased risk for chlamydia or gonorrhea. Ask your health care provider if you are at risk.  If you do not have HIV, but are at risk, it may be recommended that you take a prescription medicine daily to prevent HIV infection. This is called pre-exposure prophylaxis (PrEP). You are considered at risk if:  You are sexually active and do not regularly use condoms or know the HIV status of your partner(s).  You take drugs by injection.  You are sexually active with a partner who has HIV. Talk with your health care provider about whether you are at high risk of being infected with HIV. If you choose to begin PrEP, you should first be tested for HIV. You should then be tested every 3 months for as long as you are taking PrEP.  PREGNANCY   If you are premenopausal and  you may become pregnant, ask your health care provider about preconception counseling.  If you may become pregnant, take 400 to 800 micrograms (mcg) of folic acid every day.  If you want to prevent pregnancy, talk to your health care provider about birth control (contraception). OSTEOPOROSIS AND MENOPAUSE   Osteoporosis is a disease in which the bones lose minerals and strength with aging. This can result in serious bone fractures. Your risk for osteoporosis can be identified using a bone density scan.  If you are 66 years of age or older, or if you are at risk for osteoporosis and fractures, ask your health care provider if you should be screened.  Ask your health care provider whether you should take a calcium or vitamin D supplement to lower your risk for osteoporosis.  Menopause may have certain physical symptoms and risks.  Hormone replacement therapy may reduce some of these symptoms and risks. Talk to your health care provider about whether hormone replacement therapy is right for you.  HOME CARE INSTRUCTIONS   Schedule regular health, dental, and eye exams.  Stay current with your immunizations.   Do not use any tobacco products including cigarettes, chewing tobacco, or electronic cigarettes.  If you are pregnant, do not drink alcohol.  If you are breastfeeding, limit how much and how often you drink alcohol.  Limit alcohol intake to no more than 1 drink per day for nonpregnant women. One drink equals 12 ounces of beer, 5 ounces of wine, or 1 ounces of hard liquor.  Do not use street drugs.  Do not share needles.  Ask your health care  provider for help if you need support or information about quitting drugs.  Tell your health care provider if you often feel depressed.  Tell your health care provider if you have ever been abused or do not feel safe at home. Document Released: 08/20/2010 Document Revised: 06/21/2013 Document Reviewed: 01/06/2013 [facility] Patient  Information 2015 [place], [place]. This information is not intended to replace advice given to you by your health care provider. Make sure you discuss any questions you have with your health care provider.

## 2014-10-31 NOTE — Progress Notes (Signed)
Pre-visit discussion using our clinic review tool. No additional management support is needed unless otherwise documented below in the visit note.  

## 2014-11-02 DIAGNOSIS — Z0000 Encounter for general adult medical examination without abnormal findings: Secondary | ICD-10-CM | POA: Insufficient documentation

## 2014-11-02 NOTE — Assessment & Plan Note (Signed)

## 2014-11-02 NOTE — Assessment & Plan Note (Signed)
Well controlled on current regimen. Renal function is overdue. no changes today.

## 2014-11-02 NOTE — Assessment & Plan Note (Signed)
Thyroid function is WNL on current dose.  No current changes needed.   Lab Results  Component Value Date   TSH 1.08 10/18/2013

## 2014-11-10 ENCOUNTER — Other Ambulatory Visit: Payer: Self-pay | Admitting: Internal Medicine

## 2014-11-14 ENCOUNTER — Telehealth: Payer: Self-pay | Admitting: *Deleted

## 2014-11-14 ENCOUNTER — Other Ambulatory Visit (INDEPENDENT_AMBULATORY_CARE_PROVIDER_SITE_OTHER): Payer: BLUE CROSS/BLUE SHIELD

## 2014-11-14 DIAGNOSIS — R5383 Other fatigue: Secondary | ICD-10-CM | POA: Diagnosis not present

## 2014-11-14 DIAGNOSIS — Z113 Encounter for screening for infections with a predominantly sexual mode of transmission: Secondary | ICD-10-CM

## 2014-11-14 DIAGNOSIS — E559 Vitamin D deficiency, unspecified: Secondary | ICD-10-CM

## 2014-11-14 DIAGNOSIS — E785 Hyperlipidemia, unspecified: Secondary | ICD-10-CM | POA: Diagnosis not present

## 2014-11-14 LAB — CBC WITH DIFFERENTIAL/PLATELET
BASOS PCT: 0.7 % (ref 0.0–3.0)
Basophils Absolute: 0 10*3/uL (ref 0.0–0.1)
EOS ABS: 0.2 10*3/uL (ref 0.0–0.7)
Eosinophils Relative: 3.5 % (ref 0.0–5.0)
HEMATOCRIT: 42.3 % (ref 36.0–46.0)
Hemoglobin: 14.4 g/dL (ref 12.0–15.0)
Lymphocytes Relative: 31.1 % (ref 12.0–46.0)
Lymphs Abs: 1.7 10*3/uL (ref 0.7–4.0)
MCHC: 34 g/dL (ref 30.0–36.0)
MCV: 89.7 fl (ref 78.0–100.0)
MONO ABS: 0.6 10*3/uL (ref 0.1–1.0)
Monocytes Relative: 10.5 % (ref 3.0–12.0)
NEUTROS ABS: 3 10*3/uL (ref 1.4–7.7)
Neutrophils Relative %: 54.2 % (ref 43.0–77.0)
PLATELETS: 275 10*3/uL (ref 150.0–400.0)
RBC: 4.72 Mil/uL (ref 3.87–5.11)
RDW: 13.1 % (ref 11.5–15.5)
WBC: 5.5 10*3/uL (ref 4.0–10.5)

## 2014-11-14 LAB — LIPID PANEL
CHOLESTEROL: 169 mg/dL (ref 0–200)
HDL: 43.9 mg/dL (ref >39.00)
LDL CALC: 107 mg/dL — AB (ref 0–99)
NonHDL: 124.77
Total CHOL/HDL Ratio: 4
Triglycerides: 87 mg/dL (ref 0.0–149.0)
VLDL: 17.4 mg/dL (ref 0.0–40.0)

## 2014-11-14 LAB — COMPREHENSIVE METABOLIC PANEL
ALT: 16 U/L (ref 0–35)
AST: 20 U/L (ref 0–37)
Albumin: 4.4 g/dL (ref 3.5–5.2)
Alkaline Phosphatase: 72 U/L (ref 39–117)
BUN: 15 mg/dL (ref 6–23)
CALCIUM: 9.7 mg/dL (ref 8.4–10.5)
CHLORIDE: 103 meq/L (ref 96–112)
CO2: 28 meq/L (ref 19–32)
CREATININE: 0.75 mg/dL (ref 0.40–1.20)
GFR: 84.77 mL/min (ref >60.00)
Glucose, Bld: 101 mg/dL — ABNORMAL HIGH (ref 70–99)
POTASSIUM: 4 meq/L (ref 3.5–5.1)
Sodium: 138 mEq/L (ref 135–145)
Total Bilirubin: 0.9 mg/dL (ref 0.2–1.2)
Total Protein: 7.4 g/dL (ref 6.0–8.3)

## 2014-11-14 LAB — TSH: TSH: 2.15 u[IU]/mL (ref 0.35–4.50)

## 2014-11-14 LAB — VITAMIN D 25 HYDROXY (VIT D DEFICIENCY, FRACTURES): VITD: 26.85 ng/mL — AB (ref 30.00–100.00)

## 2014-11-14 NOTE — Telephone Encounter (Signed)
Labs and dx?  

## 2014-11-15 ENCOUNTER — Encounter: Payer: Self-pay | Admitting: *Deleted

## 2014-11-15 LAB — HEPATITIS C ANTIBODY: HCV AB: NEGATIVE

## 2014-11-15 LAB — HIV ANTIBODY (ROUTINE TESTING W REFLEX): HIV: NONREACTIVE

## 2015-04-11 ENCOUNTER — Encounter: Payer: Self-pay | Admitting: *Deleted

## 2015-04-13 ENCOUNTER — Other Ambulatory Visit: Payer: Self-pay | Admitting: Internal Medicine

## 2015-05-16 ENCOUNTER — Encounter: Payer: Self-pay | Admitting: Internal Medicine

## 2015-05-16 ENCOUNTER — Ambulatory Visit (INDEPENDENT_AMBULATORY_CARE_PROVIDER_SITE_OTHER): Payer: BLUE CROSS/BLUE SHIELD | Admitting: Internal Medicine

## 2015-05-16 VITALS — BP 138/95 | HR 66 | Temp 98.0°F | Wt 145.2 lb

## 2015-05-16 DIAGNOSIS — L309 Dermatitis, unspecified: Secondary | ICD-10-CM | POA: Diagnosis not present

## 2015-05-16 MED ORDER — DOXYCYCLINE HYCLATE 100 MG PO TABS: 100.0000 mg | ORAL_TABLET | Freq: Two times a day (BID) | ORAL | Status: DC

## 2015-05-16 NOTE — Progress Notes (Signed)
Pre visit review using our clinic review tool, if applicable. No additional management support is needed unless otherwise documented below in the visit note. 

## 2015-05-16 NOTE — Assessment & Plan Note (Signed)
Symptoms and exam most c/w dermatitis medial eyelid. However, we discussed potential early cellulitis, and also possibility of shingles. Will set up ophthalmology evaluation today. Start Doxycycline bid. Follow up after ophthalmology evaluation.

## 2015-05-16 NOTE — Progress Notes (Signed)
Subjective:    Patient ID: Kaitlyn Cortez, female    DOB: Oct 25, 1960, 55 y.o.   MRN: [id number]  HPI  55YO female presents for acute visit.  Right eye pain - Noticed last Wednesday with red bump near right eyebrow. Went to urgent care yesterday. Written for antibiotics but never filled. No drainage.  No redness. No change in vision. No fever, chills. Feels irritated, however not painful. Seems to be improving. No trauma to eye.  Wt Readings from Last 3 Encounters:  05/16/15 145 lb 4 oz (65.885 kg)  10/31/14 148 lb (67.132 kg)  07/04/14 145 lb (65.772 kg)   BP Readings from Last 3 Encounters:  05/16/15 138/95  10/31/14 126/78  07/04/14 130/78    Past Medical History  Diagnosis Date  . Menopausal menorrhagia 2009    hysterosocopy was normal,  menses ended 2009  . Overweight (BMI 25.0-29.9)   . Hyperlipidemia   . hypothyroidism   . Hypertension   . Anemia   . Diffuse cystic mastopathy 2011  . Unspecified disorder of thyroid 2011   Family History  Problem Relation Age of Onset  . Heart disease Father 33  . Heart disease Maternal Uncle   . Depression Daughter   . CAD Mother 79   Past Surgical History  Procedure Laterality Date  . Biopsy thyroid  2010    normal, Jamal Collin  takes low dose synthroid for suppression  . Tubal ligation    . Breast biopsy  2011    fibrocystic breast disease  . Hysteroscopy  2009  . Colonoscopy  2011   Social History   Social History  . Marital Status: Married    Spouse Name: N/A  . Number of Children: N/A  . Years of Education: N/A   Social History Main Topics  . Smoking status: Never Smoker   . Smokeless tobacco: Never Used  . Alcohol Use: Yes  . Drug Use: No  . Sexual Activity: Not Asked   Other Topics Concern  . None   Social History Narrative    Review of Systems  Constitutional: Negative for fever, chills and unexpected weight change.  HENT: Negative for congestion, ear discharge, ear pain, facial swelling,  hearing loss, mouth sores, nosebleeds, postnasal drip, rhinorrhea, sinus pressure, sneezing, sore throat, tinnitus, trouble swallowing and voice change.   Eyes: Positive for redness and itching. Negative for photophobia, pain, discharge and visual disturbance.  Respiratory: Negative for cough, chest tightness, shortness of breath, wheezing and stridor.   Cardiovascular: Negative for chest pain, palpitations and leg swelling.  Musculoskeletal: Negative for myalgias, arthralgias, neck pain and neck stiffness.  Skin: Negative for color change and rash.  Neurological: Negative for dizziness, weakness, light-headedness and headaches.  Hematological: Negative for adenopathy.       Objective:    BP 138/95 mmHg  Pulse 66  Temp(Src) 98 F (36.7 C) (Oral)  Wt 145 lb 4 oz (65.885 kg)  SpO2 100% Physical Exam  Constitutional: She is oriented to person, place, and time. She appears well-developed and well-nourished. No distress.  HENT:  Head: Normocephalic and atraumatic.  Right Ear: External ear normal.  Left Ear: External ear normal.  Nose: Nose normal.  Mouth/Throat: Oropharynx is clear and moist.  Eyes: EOM are normal. Pupils are equal, round, and reactive to light. Right eye exhibits no discharge. Left eye exhibits no discharge. Right conjunctiva is injected (minimal medial eye). No scleral icterus. Right eye exhibits normal extraocular motion.  Fundoscopic exam:  The right eye shows no hemorrhage.    Neck: Normal range of motion. Neck supple. No tracheal deviation present. No thyromegaly present.  Pulmonary/Chest: Effort normal. No accessory muscle usage. No tachypnea. She has no decreased breath sounds. She has no rhonchi.  Musculoskeletal: Normal range of motion. She exhibits no edema or tenderness.  Lymphadenopathy:    She has no cervical adenopathy.  Neurological: She is alert and oriented to person, place, and time. No cranial nerve deficit. She exhibits normal muscle tone.  Coordination normal.  Skin: Skin is warm and dry. No rash noted. She is not diaphoretic. No erythema. No pallor.  Psychiatric: She has a normal mood and affect. Her behavior is normal. Judgment and thought content normal.          Assessment & Plan:   Problem List Items Addressed This Visit      Unprioritized   Dermatitis - Primary    Symptoms and exam most c/w dermatitis medial eyelid. However, we discussed potential early cellulitis, and also possibility of shingles. Will set up ophthalmology evaluation today. Start Doxycycline bid. Follow up after ophthalmology evaluation.      Relevant Medications   doxycycline (VIBRA-TABS) 100 MG tablet   Other Relevant Orders   Ambulatory referral to Ophthalmology       Return if symptoms worsen or fail to improve.  Ronette Deter, MD [facility]

## 2015-06-26 ENCOUNTER — Encounter: Payer: Self-pay | Admitting: General Surgery

## 2015-07-01 ENCOUNTER — Other Ambulatory Visit: Payer: Self-pay | Admitting: Internal Medicine

## 2015-07-03 ENCOUNTER — Encounter: Payer: Self-pay | Admitting: General Surgery

## 2015-07-03 ENCOUNTER — Ambulatory Visit (INDEPENDENT_AMBULATORY_CARE_PROVIDER_SITE_OTHER): Payer: BLUE CROSS/BLUE SHIELD | Admitting: General Surgery

## 2015-07-03 VITALS — BP 120/60 | HR 74 | Resp 13 | Ht 61.0 in | Wt 143.0 lb

## 2015-07-03 DIAGNOSIS — Z1231 Encounter for screening mammogram for malignant neoplasm of breast: Secondary | ICD-10-CM

## 2015-07-03 DIAGNOSIS — Z872 Personal history of diseases of the skin and subcutaneous tissue: Secondary | ICD-10-CM

## 2015-07-03 DIAGNOSIS — R591 Generalized enlarged lymph nodes: Secondary | ICD-10-CM

## 2015-07-03 DIAGNOSIS — R599 Enlarged lymph nodes, unspecified: Secondary | ICD-10-CM

## 2015-07-03 NOTE — Patient Instructions (Addendum)
   Patient to return in 6 week to follow up on swollen neck nodular.  The patient has been asked to return to the office in one year with a bilateral screening mammogram.

## 2015-07-03 NOTE — Progress Notes (Signed)
Patient ID: Kaitlyn Cortez, female   DOB: 1960/04/30, 55 y.o.   MRN: [id number]  Chief Complaint  Patient presents with  . Follow-up    mammogram    HPI Kaitlyn Cortez is a 55 y.o. female who presents for a breast evaluation. The most recent mammogram was done on 06/26/15. Patient does perform regular self breast checks and gets regular mammograms done.   I have reviewed the history of present illness with the patient.   HPI  Past Medical History  Diagnosis Date  . Menopausal menorrhagia 2009    hysterosocopy was normal,  menses ended 2009  . Overweight (BMI 25.0-29.9)   . Hyperlipidemia   . hypothyroidism   . Hypertension   . Anemia   . Diffuse cystic mastopathy 2011  . Unspecified disorder of thyroid 2011    Past Surgical History  Procedure Laterality Date  . Biopsy thyroid  2010    normal, Jamal Collin  takes low dose synthroid for suppression  . Tubal ligation    . Breast biopsy  2011    fibrocystic breast disease  . Hysteroscopy  2009  . Colonoscopy  2011    Family History  Problem Relation Age of Onset  . Heart disease Father 78  . Heart disease Maternal Uncle   . CAD Mother 40    Social History Social History  Substance Use Topics  . Smoking status: Never Smoker   . Smokeless tobacco: Never Used  . Alcohol Use: Yes    No Known Allergies  Current Outpatient Prescriptions  Medication Sig Dispense Refill  . levothyroxine (SYNTHROID, LEVOTHROID) 75 MCG tablet Take 1 tablet (75 mcg total) by mouth daily. 90 tablet 4  . simvastatin (ZOCOR) 20 MG tablet TAKE 1 TABLET (20 MG TOTAL) BY MOUTH EVERY EVENING. 30 tablet 12  . triamterene-hydrochlorothiazide (MAXZIDE-25) 37.5-25 MG tablet TAKE 1 TABLET BY MOUTH DAILY. 30 tablet 0   No current facility-administered medications for this visit.    Review of Systems Review of Systems  Constitutional: Negative.   Respiratory: Negative.   Cardiovascular: Negative.     Blood pressure 120/60, pulse 74, resp. rate 13,  height 5\' 1"  (1.549 m), weight 143 lb (64.864 kg).  Physical Exam Physical Exam  Constitutional: She is oriented to person, place, and time. She appears well-nourished.  Eyes: Conjunctivae are normal. No scleral icterus.  Neck: Neck supple.  1 cm left neck node .Located posterior triangle, firm, mobile.  Cardiovascular: Normal rate, regular rhythm and normal heart sounds.   Pulmonary/Chest: Effort normal and breath sounds normal. Right breast exhibits no inverted nipple, no mass, no nipple discharge, no skin change and no tenderness. Left breast exhibits no inverted nipple, no mass, no nipple discharge, no skin change and no tenderness.  Abdominal: Soft. Bowel sounds are normal. There is no tenderness.  Lymphadenopathy:    She has no cervical adenopathy.    She has no axillary adenopathy.  Neurological: She is alert and oriented to person, place, and time.  Skin: Skin is warm and dry.    Data Reviewed Mammogram reviewed and stable  Assessment   History of breast cyst. Thyromegaly-on thyroid suppression. Left neck node-likely hyperplastic    Plan   Patient to return in 6 week to follow up on swollen neck node.     Patient to return in one year with screening mammogram.     PCP: Deirdre Pippins 07/03/2015, 12:51 PM

## 2015-07-05 ENCOUNTER — Other Ambulatory Visit: Payer: Self-pay | Admitting: Internal Medicine

## 2015-07-27 ENCOUNTER — Encounter: Payer: Self-pay | Admitting: *Deleted

## 2015-08-03 ENCOUNTER — Ambulatory Visit (INDEPENDENT_AMBULATORY_CARE_PROVIDER_SITE_OTHER): Payer: BLUE CROSS/BLUE SHIELD | Admitting: General Surgery

## 2015-08-03 ENCOUNTER — Encounter: Payer: Self-pay | Admitting: General Surgery

## 2015-08-03 VITALS — BP 140/82 | HR 76 | Resp 12 | Ht 61.0 in | Wt 142.0 lb

## 2015-08-03 DIAGNOSIS — R599 Enlarged lymph nodes, unspecified: Secondary | ICD-10-CM

## 2015-08-03 DIAGNOSIS — R591 Generalized enlarged lymph nodes: Secondary | ICD-10-CM | POA: Diagnosis not present

## 2015-08-03 MED ORDER — CEPHALEXIN 500 MG PO CAPS: 500.0000 mg | ORAL_CAPSULE | Freq: Two times a day (BID) | ORAL | Status: AC

## 2015-08-03 NOTE — Patient Instructions (Signed)
The patient is aware to call back for any questions or concerns.  

## 2015-08-03 NOTE — Progress Notes (Signed)
Patient ID: Kaitlyn Cortez, female   DOB: Dec 12, 1960, 55 y.o.   MRN: [id number]    Here today for follow up left neck lymph node that was present at her May visit when she had an upper respiratory infection which has since cleared up. She denies any complaints.  No axillary lymphadenopathy. Left spraclavicular lymph node present: Has not increased in size, and is more firm to palpation. Previously 1 cm left neck node .Located posterior triangle, firm, mobile. No additional cervical lymph nodes palpable.   Plan:  Prescribe short course of antibiotics.  If has not decreased in size following course of antibiotics, plan on biopsy. Patient agreeable to plan.  Follow-up in three weeks.     PCP:  Deborra Medina  This information has been scribed by Karie Fetch RN, BSN,BC.

## 2015-08-04 ENCOUNTER — Encounter: Payer: Self-pay | Admitting: General Surgery

## 2015-08-28 ENCOUNTER — Ambulatory Visit (INDEPENDENT_AMBULATORY_CARE_PROVIDER_SITE_OTHER): Payer: BLUE CROSS/BLUE SHIELD | Admitting: General Surgery

## 2015-08-28 ENCOUNTER — Encounter: Payer: Self-pay | Admitting: General Surgery

## 2015-08-28 VITALS — BP 116/68 | HR 68 | Resp 12 | Ht 61.0 in | Wt 143.0 lb

## 2015-08-28 DIAGNOSIS — R599 Enlarged lymph nodes, unspecified: Secondary | ICD-10-CM

## 2015-08-28 DIAGNOSIS — R591 Generalized enlarged lymph nodes: Secondary | ICD-10-CM

## 2015-08-28 NOTE — Patient Instructions (Signed)
Patient to return as scheduled.  

## 2015-08-28 NOTE — Progress Notes (Signed)
Patient ID: Kaitlyn Cortez, female   DOB: 10-Jul-1960, 55 y.o.   MRN: [id number]  Chief Complaint  Patient presents with  . Other    swollen neck node    HPI Kaitlyn Cortez is a 55 y.o. female here today for an enlarged left neck node. She completed course of antibiotic. No complaints. I have reviewed the history of present illness with the patient.  HPI  Past Medical History  Diagnosis Date  . Menopausal menorrhagia 2009    hysterosocopy was normal,  menses ended 2009  . Overweight (BMI 25.0-29.9)   . Hyperlipidemia   . hypothyroidism   . Hypertension   . Anemia   . Diffuse cystic mastopathy 2011  . Unspecified disorder of thyroid 2011    Past Surgical History  Procedure Laterality Date  . Biopsy thyroid  2010    normal, Kaitlyn Cortez  takes low dose synthroid for suppression  . Tubal ligation    . Breast biopsy  2011    fibrocystic breast disease  . Hysteroscopy  2009  . Colonoscopy  2011    Family History  Problem Relation Age of Onset  . Heart disease Father 82  . Heart disease Maternal Uncle   . CAD Mother 33    Social History Social History  Substance Use Topics  . Smoking status: Never Smoker   . Smokeless tobacco: Never Used  . Alcohol Use: Yes    No Known Allergies  Current Outpatient Prescriptions  Medication Sig Dispense Refill  . levothyroxine (SYNTHROID, LEVOTHROID) 75 MCG tablet Take 1 tablet (75 mcg total) by mouth daily. 90 tablet 4  . simvastatin (ZOCOR) 20 MG tablet TAKE 1 TABLET (20 MG TOTAL) BY MOUTH EVERY EVENING. 30 tablet 12  . triamterene-hydrochlorothiazide (MAXZIDE-25) 37.5-25 MG tablet TAKE 1 TABLET BY MOUTH DAILY. 30 tablet 0   No current facility-administered medications for this visit.    Review of Systems Review of Systems  Constitutional: Negative.   Respiratory: Negative.   Cardiovascular: Negative.     Blood pressure 116/68, pulse 68, resp. rate 12, height 5\' 1"  (1.549 m), weight 143 lb (64.864 kg).  Physical  Exam Physical Exam  Constitutional: She is oriented to person, place, and time. She appears well-developed and well-nourished.  Lymphadenopathy:    She has no cervical adenopathy.  Left supraclavicular node is now 0.5cm, was 1cm before.  Neurological: She is alert and oriented to person, place, and time.  Skin: Skin is warm and dry.    Data Reviewed Prior notes reviewed  Assessment    Enlarged left supraclavicular node- appears to have decreased in size after one course of antibiotic. This is likely benign reactive node.  l . Plan      Patient to return as scheduled .  PCP:  Deborra Medina  This information has been scribed by Gaspar Cola CMA.    SANKAR,SEEPLAPUTHUR G 08/30/2015, 8:51 AM

## 2015-08-30 ENCOUNTER — Encounter: Payer: Self-pay | Admitting: General Surgery

## 2015-11-03 ENCOUNTER — Encounter: Payer: BC Managed Care – PPO | Admitting: Internal Medicine

## 2015-11-06 ENCOUNTER — Encounter: Payer: Self-pay | Admitting: Internal Medicine

## 2015-11-06 ENCOUNTER — Ambulatory Visit (INDEPENDENT_AMBULATORY_CARE_PROVIDER_SITE_OTHER): Payer: BLUE CROSS/BLUE SHIELD | Admitting: Internal Medicine

## 2015-11-06 VITALS — BP 104/64 | HR 65 | Temp 97.9°F | Ht 60.75 in | Wt 143.2 lb

## 2015-11-06 DIAGNOSIS — E785 Hyperlipidemia, unspecified: Secondary | ICD-10-CM

## 2015-11-06 DIAGNOSIS — Z0000 Encounter for general adult medical examination without abnormal findings: Secondary | ICD-10-CM

## 2015-11-06 DIAGNOSIS — E559 Vitamin D deficiency, unspecified: Secondary | ICD-10-CM | POA: Diagnosis not present

## 2015-11-06 DIAGNOSIS — E038 Other specified hypothyroidism: Secondary | ICD-10-CM | POA: Diagnosis not present

## 2015-11-06 DIAGNOSIS — E034 Atrophy of thyroid (acquired): Secondary | ICD-10-CM | POA: Diagnosis not present

## 2015-11-06 DIAGNOSIS — I10 Essential (primary) hypertension: Secondary | ICD-10-CM | POA: Diagnosis not present

## 2015-11-06 LAB — COMPREHENSIVE METABOLIC PANEL
ALK PHOS: 80 U/L (ref 39–117)
ALT: 13 U/L (ref 0–35)
AST: 14 U/L (ref 0–37)
Albumin: 4.4 g/dL (ref 3.5–5.2)
BUN: 16 mg/dL (ref 6–23)
CHLORIDE: 104 meq/L (ref 96–112)
CO2: 29 mEq/L (ref 19–32)
Calcium: 9.9 mg/dL (ref 8.4–10.5)
Creatinine, Ser: 0.69 mg/dL (ref 0.40–1.20)
GFR: 93.01 mL/min (ref >60.00)
GLUCOSE: 96 mg/dL (ref 70–99)
POTASSIUM: 4.1 meq/L (ref 3.5–5.1)
SODIUM: 139 meq/L (ref 135–145)
TOTAL PROTEIN: 7.5 g/dL (ref 6.0–8.3)
Total Bilirubin: 0.8 mg/dL (ref 0.2–1.2)

## 2015-11-06 LAB — LIPID PANEL
CHOL/HDL RATIO: 5
Cholesterol: 177 mg/dL (ref 0–200)
HDL: 39.2 mg/dL (ref >39.00)
NonHDL: 137.55
Triglycerides: 251 mg/dL — ABNORMAL HIGH (ref 0.0–149.0)
VLDL: 50.2 mg/dL — AB (ref 0.0–40.0)

## 2015-11-06 LAB — LDL CHOLESTEROL, DIRECT: Direct LDL: 112 mg/dL

## 2015-11-06 LAB — TSH: TSH: 2.34 u[IU]/mL (ref 0.35–4.50)

## 2015-11-06 LAB — VITAMIN D 25 HYDROXY (VIT D DEFICIENCY, FRACTURES): VITD: 22.46 ng/mL — AB (ref 30.00–100.00)

## 2015-11-06 NOTE — Progress Notes (Signed)
Patient ID: Kaitlyn Cortez, female    DOB: 1960/03/24  Age: 55 y.o. MRN: [id number]  The patient is here for annual  wellness examination and management of other chronic and acute problems.    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     The risk factors are reflected in the social history.  The roster of all physicians providing medical care to patient - is listed in the Snapshot section of the chart.  Home safety : The patient has smoke detectors in the home. They wear seatbelts.  There are no firearms at home. There is no violence in the home.   There is no risks for hepatitis, STDs or HIV. There is no   history of blood transfusion. They have no travel history to infectious disease endemic areas of the world.  The patient has seen their dentist in the last six month. They have seen their eye doctor in the last year.  . Discussed the need for sun protection: hats, long sleeves and use of sunscreen if there is significant sun exposure.   Diet: the importance of a healthy diet is discussed. They do have a healthy diet.  The benefits of regular aerobic exercise were discussed. She walks 4 times per week ,  20 minutes.   Depression screen: there are no signs or vegative symptoms of depression- irritability, change in appetite, anhedonia, sadness/tearfullness.  The following portions of the patient's history were reviewed and updated as appropriate: allergies, current medications, past family history, past medical history,  past surgical history, past social history  and problem list.  Visual acuity was not assessed per patient preference since she has regular follow up with her ophthalmologist. Hearing and body mass index were assessed and reviewed.   During the course of the visit the patient was educated and counseled about appropriate screening and preventive services including : fall  prevention , diabetes screening, nutrition counseling, colorectal cancer screening, and recommended immunizations.    CC: The primary encounter diagnosis was Vitamin D deficiency. Diagnoses of Essential hypertension, Hyperlipidemia, Visit for preventive health examination, and Hypothyroidism due to acquired atrophy of thyroid were also pertinent to this visit.  Patient is taking her medications as prescribed and notes no adverse effects.  Home BP readings have been done about once per week and are  generally < 130/80 .  She is avoiding added salt in her diet and walking regularly about 3 times per week for exercise  .  Overweight :  Her Weight is stable BMI 27. She wants to lose weight.  Personal  Best was  138 lbs.   Scratched a mole on her back which bled.    Remote history of sudden attack of vertigo in  2015 with pulsating headache,  was treated in [facility] ED with CT scan  And Valium.    History [name] has a past medical history of Anemia; Diffuse cystic mastopathy (2011); Hyperlipidemia; Hypertension; hypothyroidism; Menopausal menorrhagia (2009); Overweight (BMI 25.0-29.9); and Unspecified disorder of thyroid (2011).   She has a past surgical history that includes Biopsy thyroid (2010); Tubal ligation; Breast biopsy (2011); Hysteroscopy (2009); and Colonoscopy (2011).   Her family history includes CAD (age of onset: 63) in her mother; Heart disease in her maternal uncle; Heart disease (age of onset: 78) in her father.She reports that she has never smoked. She has never used smokeless tobacco. She reports that she drinks  alcohol. She reports that she does not use drugs.  Outpatient Medications Prior to Visit  Medication Sig Dispense Refill  . levothyroxine (SYNTHROID, LEVOTHROID) 75 MCG tablet Take 1 tablet (75 mcg total) by mouth daily. 90 tablet 4  . simvastatin (ZOCOR) 20 MG tablet TAKE 1 TABLET (20 MG TOTAL) BY MOUTH EVERY EVENING. 30 tablet 12  . triamterene-hydrochlorothiazide  (MAXZIDE-25) 37.5-25 MG tablet TAKE 1 TABLET BY MOUTH DAILY. 30 tablet 0   No facility-administered medications prior to visit.     Review of Systems   Patient denies headache, fevers, malaise, unintentional weight loss, skin rash, eye pain, sinus congestion and sinus pain, sore throat, dysphagia,  hemoptysis , cough, dyspnea, wheezing, chest pain, palpitations, orthopnea, edema, abdominal pain, nausea, melena, diarrhea, constipation, flank pain, dysuria, hematuria, urinary  Frequency, nocturia, numbness, tingling, seizures,  Focal weakness, Loss of consciousness,  Tremor, insomnia, depression, anxiety, and suicidal ideation.     Objective:  BP 104/64   Pulse 65   Temp 97.9 F (36.6 C) (Oral)   Ht 5' 0.75" (1.543 m)   Wt 143 lb 4 oz (65 kg)   SpO2 96%   BMI 27.29 kg/m   Physical Exam   General appearance: alert, cooperative and appears stated age Head: Normocephalic, without obvious abnormality, atraumatic Eyes: conjunctivae/corneas clear. PERRL, EOM's intact. Fundi benign. Ears: normal TM's and external ear canals both ears Nose: Nares normal. Septum midline. Mucosa normal. No drainage or sinus tenderness. Throat: lips, mucosa, and tongue normal; teeth and gums normal Neck: no adenopathy, no carotid bruit, no JVD, supple, symmetrical, trachea midline and thyroid not enlarged, symmetric, no tenderness/mass/nodules Lungs: clear to auscultation bilaterally Breasts: normal appearance, no masses or tenderness Heart: regular rate and rhythm, S1, S2 normal, no murmur, click, rub or gallop Abdomen: soft, non-tender; bowel sounds normal; no masses,  no organomegaly Extremities: extremities normal, atraumatic, no cyanosis or edema Pulses: 2+ and symmetric Skin: Skin color, texture, turgor normal. No rashes or lesions Neurologic: Alert and oriented X 3, normal strength and tone. Normal symmetric reflexes. Normal coordination and gait.    Assessment & Plan:   Problem List Items  Addressed This Visit    Hypertension    Well controlled on current regimen. Renal function stable, no changes today.  Lab Results  Component Value Date   CREATININE 0.69 11/06/2015   Lab Results  Component Value Date   NA 139 11/06/2015   K 4.1 11/06/2015   CL 104 11/06/2015   CO2 29 11/06/2015         Relevant Orders   Comprehensive metabolic panel (Completed)   Hypothyroidism    Thyroid function is WNL on current dose.  No current changes needed.   Lab Results  Component Value Date   TSH 2.34 11/06/2015         Hyperlipidemia    LDL and triglycerides are at goal on current medications. She has no side effects and liver enzymes are normal. No changes today   Lab Results  Component Value Date   CHOL 177 11/06/2015   HDL 39.20 11/06/2015   LDLCALC 107 (H) 11/14/2014   LDLDIRECT 112.0 11/06/2015   TRIG 251.0 (H) 11/06/2015   CHOLHDL 5 11/06/2015   Lab Results  Component Value Date   ALT 13 11/06/2015   AST 14 11/06/2015   ALKPHOS 80 11/06/2015   BILITOT 0.8 11/06/2015         Relevant Orders   Lipid panel (Completed)   TSH (Completed)   Visit for  preventive health examination    Annual comprehensive preventive exam was done as well as an evaluation and management of chronic conditions, including overweight, hypertension and hyperlipidemia.  During the course of the visit the patient was educated and counseled about appropriate screening and preventive services including :  diabetes screening, lipid analysis with projected  10 year  risk for CAD , nutrition counseling, breast, cervical and colorectal cancer screening, and recommended immunizations.  Printed recommendations for health maintenance screenings was given.       Other Visit Diagnoses    Vitamin D deficiency    -  Primary   Relevant Orders   VITAMIN D 25 Hydroxy (Vit-D Deficiency, Fractures) (Completed)      I am having Ms. Vanausdal maintain her levothyroxine, simvastatin, and  triamterene-hydrochlorothiazide.  No orders of the defined types were placed in this encounter.   There are no discontinued medications.  Follow-up: No Follow-up on file.   Crecencio Mc, MD

## 2015-11-06 NOTE — Progress Notes (Signed)
Pre visit review using our clinic review tool, if applicable. No additional management support is needed unless otherwise documented below in the visit note. 

## 2015-11-06 NOTE — Patient Instructions (Signed)

## 2015-11-07 NOTE — Assessment & Plan Note (Signed)
Annual comprehensive preventive exam was done as well as an evaluation and management of chronic conditions, including overweight, hypertension and hyperlipidemia.  During the course of the visit the patient was educated and counseled about appropriate screening and preventive services including :  diabetes screening, lipid analysis with projected  10 year  risk for CAD , nutrition counseling, breast, cervical and colorectal cancer screening, and recommended immunizations.  Printed recommendations for health maintenance screenings was given.

## 2015-11-07 NOTE — Assessment & Plan Note (Addendum)
LDL and triglycerides are at goal on current medications. She has no side effects and liver enzymes are normal. No changes today   Lab Results  Component Value Date   CHOL 177 11/06/2015   HDL 39.20 11/06/2015   LDLCALC 107 (H) 11/14/2014   LDLDIRECT 112.0 11/06/2015   TRIG 251.0 (H) 11/06/2015   CHOLHDL 5 11/06/2015   Lab Results  Component Value Date   ALT 13 11/06/2015   AST 14 11/06/2015   ALKPHOS 80 11/06/2015   BILITOT 0.8 11/06/2015

## 2015-11-07 NOTE — Assessment & Plan Note (Signed)
Well controlled on current regimen. Renal function stable, no changes today.  Lab Results  Component Value Date   CREATININE 0.69 11/06/2015   Lab Results  Component Value Date   NA 139 11/06/2015   K 4.1 11/06/2015   CL 104 11/06/2015   CO2 29 11/06/2015

## 2015-11-07 NOTE — Assessment & Plan Note (Signed)
Thyroid function is WNL on current dose.  No current changes needed.   Lab Results  Component Value Date   TSH 2.34 11/06/2015

## 2015-11-08 DIAGNOSIS — E559 Vitamin D deficiency, unspecified: Secondary | ICD-10-CM | POA: Insufficient documentation

## 2015-11-08 MED ORDER — ERGOCALCIFEROL 1.25 MG (50000 UT) PO CAPS: 50000.0000 [IU] | ORAL_CAPSULE | ORAL | 0 refills | Status: DC

## 2015-11-08 NOTE — Assessment & Plan Note (Signed)
Your vitamin D is low,with your body's ability to absorb the calcium in your diet.   I am calling in a megadose of Vit D to take once weekly for a total of 3 months,  Then after you finish the weekly supplement, you should start taking an OTC  Vit D3 supplement 1000 units daily.

## 2015-11-08 NOTE — Addendum Note (Signed)
Addended by: Crecencio Mc on: 11/08/2015 08:21 PM   Modules accepted: Orders

## 2015-11-09 ENCOUNTER — Encounter: Payer: Self-pay | Admitting: *Deleted

## 2015-12-06 ENCOUNTER — Other Ambulatory Visit: Payer: Self-pay | Admitting: Internal Medicine

## 2016-04-24 ENCOUNTER — Other Ambulatory Visit: Payer: Self-pay | Admitting: General Surgery

## 2016-06-25 ENCOUNTER — Encounter: Payer: Self-pay | Admitting: *Deleted

## 2016-07-01 ENCOUNTER — Encounter: Payer: Self-pay | Admitting: General Surgery

## 2016-07-01 ENCOUNTER — Ambulatory Visit (INDEPENDENT_AMBULATORY_CARE_PROVIDER_SITE_OTHER): Payer: BLUE CROSS/BLUE SHIELD | Admitting: General Surgery

## 2016-07-01 VITALS — BP 126/78 | HR 78 | Resp 12 | Ht 62.0 in | Wt 142.0 lb

## 2016-07-01 DIAGNOSIS — Z8639 Personal history of other endocrine, nutritional and metabolic disease: Secondary | ICD-10-CM

## 2016-07-01 DIAGNOSIS — Z1231 Encounter for screening mammogram for malignant neoplasm of breast: Secondary | ICD-10-CM

## 2016-07-01 DIAGNOSIS — Z872 Personal history of diseases of the skin and subcutaneous tissue: Secondary | ICD-10-CM | POA: Diagnosis not present

## 2016-07-01 NOTE — Patient Instructions (Addendum)
Patient will be asked to return to her PCP in one year with a bilateral screening mammogram and breast checks. The patient is aware to call back for any questions or concerns.

## 2016-07-01 NOTE — Progress Notes (Signed)
Patient ID: Kaitlyn Cortez, female   DOB: 04-26-1960, 56 y.o.   MRN: [id number]  Chief Complaint  Patient presents with  . Follow-up    mammogram     HPI Kaitlyn Cortez is a 56 y.o. female who presents for a breast evaluation. The most recent mammogram was done on 06/27/2016.  Patient does perform regular self breast checks and gets regular mammograms done.    HPI  Past Medical History:  Diagnosis Date  . Anemia   . Diffuse cystic mastopathy 2011  . Hyperlipidemia   . Hypertension   . hypothyroidism   . Menopausal menorrhagia 2009   hysterosocopy was normal,  menses ended 2009  . Overweight (BMI 25.0-29.9)   . Unspecified disorder of thyroid 2011    Past Surgical History:  Procedure Laterality Date  . BIOPSY THYROID  2010   normal, Shekita Boyden  takes low dose synthroid for suppression  . BREAST BIOPSY  2011   fibrocystic breast disease  . COLONOSCOPY  2011  . HYSTEROSCOPY  2009  . TUBAL LIGATION      Family History  Problem Relation Age of Onset  . Heart disease Father 59  . Heart disease Maternal Uncle   . CAD Mother 59    Social History Social History  Substance Use Topics  . Smoking status: Never Smoker  . Smokeless tobacco: Never Used  . Alcohol use Yes    No Known Allergies  Current Outpatient Prescriptions  Medication Sig Dispense Refill  . simvastatin (ZOCOR) 20 MG tablet TAKE 1 TABLET (20 MG TOTAL) BY MOUTH EVERY EVENING. 30 tablet 11  . SYNTHROID 75 MCG tablet TAKE 1 TABLET (75 MCG TOTAL) BY MOUTH DAILY. 30 tablet 10  . triamterene-hydrochlorothiazide (MAXZIDE-25) 37.5-25 MG tablet TAKE 1 TABLET BY MOUTH DAILY. 30 tablet 11   No current facility-administered medications for this visit.     Review of Systems Review of Systems  Constitutional: Negative.   Respiratory: Negative.   Cardiovascular: Negative.     Blood pressure 126/78, pulse 78, resp. rate 12, height 5\' 2"  (1.575 m), weight 142 lb (64.4 kg).  Physical Exam Physical Exam   Constitutional: She is oriented to person, place, and time. She appears well-developed and well-nourished.  Eyes: Conjunctivae are normal. No scleral icterus.  Neck: Trachea normal. Neck supple. No thyroid mass and no thyromegaly present.  Cardiovascular: Normal rate, regular rhythm and normal heart sounds.   Pulmonary/Chest: Effort normal and breath sounds normal. Right breast exhibits no inverted nipple, no mass, no nipple discharge, no skin change and no tenderness. Left breast exhibits no inverted nipple, no mass, no nipple discharge, no skin change and no tenderness.  Abdominal: Soft. Bowel sounds are normal. There is no tenderness.  Lymphadenopathy:    She has no cervical adenopathy.    She has no axillary adenopathy.  Neurological: She is alert and oriented to person, place, and time.  Skin: Skin is warm and dry.    Data Reviewed Mammogram reviewed   Assessment    History of breast cysts. Benign thyroid nodule, on synthroid. Stable exam.    Plan    Patient will be asked to return to her PCP for yearly   bilateral screening mammogram and breast checks.  Advised she can call here for any questions or concerns HPI, Physical Exam, Assessment and Plan have been scribed under the direction and in the presence of Mckinley Jewel, MD  Gaspar Cola, CMA    I have completed the exam and  reviewed the above documentation for accuracy and completeness.  I agree with the above.  Haematologist has been used and any errors in dictation or transcription are unintentional.  Avant Printy G. Jamal Collin, M.D., F.A.C.S.     Junie Panning G 07/01/2016, 12:47 PM

## 2016-07-22 ENCOUNTER — Telehealth: Payer: Self-pay | Admitting: *Deleted

## 2016-07-22 NOTE — Telephone Encounter (Signed)
Opened in error

## 2016-07-24 ENCOUNTER — Ambulatory Visit: Payer: BC Managed Care – PPO | Admitting: Family

## 2016-07-24 ENCOUNTER — Ambulatory Visit (INDEPENDENT_AMBULATORY_CARE_PROVIDER_SITE_OTHER): Payer: BLUE CROSS/BLUE SHIELD | Admitting: Family

## 2016-07-24 ENCOUNTER — Encounter: Payer: Self-pay | Admitting: Family

## 2016-07-24 VITALS — BP 108/78 | HR 85 | Temp 98.1°F | Ht 62.0 in | Wt 144.1 lb

## 2016-07-24 DIAGNOSIS — J40 Bronchitis, not specified as acute or chronic: Secondary | ICD-10-CM

## 2016-07-24 MED ORDER — AZITHROMYCIN 250 MG PO TABS: ORAL_TABLET | ORAL | 0 refills | Status: DC

## 2016-07-24 MED ORDER — PROAIR HFA 108 (90 BASE) MCG/ACT IN AERS: 1.0000 | INHALATION_SPRAY | Freq: Four times a day (QID) | RESPIRATORY_TRACT | 1 refills | Status: DC | PRN

## 2016-07-24 NOTE — Progress Notes (Signed)
Pre visit review using our clinic review tool, if applicable. No additional management support is needed unless otherwise documented below in the visit note. 

## 2016-07-24 NOTE — Patient Instructions (Signed)
Start z pak  Use albuterol every 6 hours for first 24 hours to get good medication into the lungs and loosen congestion; after, you may use as needed and eventually stop all together when cough resolves.  Let me know if not better

## 2016-07-24 NOTE — Progress Notes (Signed)
Subjective:    Patient ID: Kaitlyn Cortez, female    DOB: [birth year], 58 y.o.   MRN: [id number]  CC: RANISHA ALLAIRE is a 56 y.o. female who presents today for an acute visit.    HPI: CC: dry to productive cough x 10 days, waxing and waning.  Worse when laying down. Feels wheeze. No sob, cp, palpitations, sinus pain, sore throat, fever.   Seen at [facility] one week ago- started on tussionex, tessalon, inhaler, flonase.had a neb treatment while there for wheezing.   Nonsmoker. No lung disease      HISTORY:  Past Medical History:  Diagnosis Date  . Anemia   . Diffuse cystic mastopathy 2011  . Hyperlipidemia   . Hypertension   . hypothyroidism   . Menopausal menorrhagia 2009   hysterosocopy was normal,  menses ended 2009  . Overweight (BMI 25.0-29.9)   . Unspecified disorder of thyroid 2011   Past Surgical History:  Procedure Laterality Date  . BIOPSY THYROID  2010   normal, Sankar  takes low dose synthroid for suppression  . BREAST BIOPSY  2011   fibrocystic breast disease  . COLONOSCOPY  2011  . HYSTEROSCOPY  2009  . TUBAL LIGATION     Family History  Problem Relation Age of Onset  . Heart disease Father 43  . Heart disease Maternal Uncle   . CAD Mother 42    Allergies: Patient has no known allergies. Current Outpatient Prescriptions on File Prior to Visit  Medication Sig Dispense Refill  . simvastatin (ZOCOR) 20 MG tablet TAKE 1 TABLET (20 MG TOTAL) BY MOUTH EVERY EVENING. 30 tablet 11  . SYNTHROID 75 MCG tablet TAKE 1 TABLET (75 MCG TOTAL) BY MOUTH DAILY. 30 tablet 10  . triamterene-hydrochlorothiazide (MAXZIDE-25) 37.5-25 MG tablet TAKE 1 TABLET BY MOUTH DAILY. 30 tablet 11   No current facility-administered medications on file prior to visit.     Social History  Substance Use Topics  . Smoking status: Never Smoker  . Smokeless tobacco: Never Used  . Alcohol use Yes    Review of Systems  Constitutional: Negative for chills and fever.  HENT:  Positive for congestion. Negative for sinus pressure and sore throat.   Respiratory: Positive for cough and wheezing. Negative for shortness of breath.   Cardiovascular: Negative for chest pain and palpitations.  Gastrointestinal: Negative for nausea and vomiting.      Objective:    BP 108/78   Pulse 85   Temp 98.1 F (36.7 C) (Oral)   Ht 5\' 2"  (1.575 m)   Wt 144 lb 1.9 oz (65.4 kg)   SpO2 93%   BMI 26.36 kg/m    Physical Exam  Constitutional: She appears well-developed and well-nourished.  HENT:  Head: Normocephalic and atraumatic.  Right Ear: Hearing, tympanic membrane, external ear and ear canal normal. No drainage, swelling or tenderness. No foreign bodies. Tympanic membrane is not erythematous and not bulging. No middle ear effusion. No decreased hearing is noted.  Left Ear: Hearing, tympanic membrane, external ear and ear canal normal. No drainage, swelling or tenderness. No foreign bodies. Tympanic membrane is not erythematous and not bulging.  No middle ear effusion. No decreased hearing is noted.  Nose: Nose normal. No rhinorrhea. Right sinus exhibits no maxillary sinus tenderness and no frontal sinus tenderness. Left sinus exhibits no maxillary sinus tenderness and no frontal sinus tenderness.  Mouth/Throat: Uvula is midline, oropharynx is clear and moist and mucous membranes are normal. No oropharyngeal exudate, posterior  oropharyngeal edema, posterior oropharyngeal erythema or tonsillar abscesses.  Eyes: Conjunctivae are normal.  Cardiovascular: Regular rhythm, normal heart sounds and normal pulses.   Pulmonary/Chest: Effort normal. She has decreased breath sounds in the right lower field and the left lower field. She has no wheezes. She has no rhonchi. She has no rales.  Lymphadenopathy:       Head (right side): No submental, no submandibular, no tonsillar, no preauricular, no posterior auricular and no occipital adenopathy present.       Head (left side): No submental,  no submandibular, no tonsillar, no preauricular, no posterior auricular and no occipital adenopathy present.    She has no cervical adenopathy.  Neurological: She is alert.  Skin: Skin is warm and dry.  Psychiatric: She has a normal mood and affect. Her speech is normal and behavior is normal. Thought content normal.  Vitals reviewed.  Patient felt slightly better after albuterol treatment. Lung sounds clear and increased     Assessment & Plan:   1. Bronchitis Afebrile. No acute respiratory distress. Patient is well-appearing and improved slightly with nebulizer treatment. Based on duration of symptoms, patient and I agreed on starting antibiotics. Encouraged probiotics. Return precautions given. - azithromycin (ZITHROMAX) 250 MG tablet; Tale 500 mg PO on day 1, then 250 mg PO q24h x 4 days.  Dispense: 6 tablet; Refill: 0 - PROAIR HFA 108 (90 Base) MCG/ACT inhaler; Inhale 1 puff into the lungs every 6 (six) hours as needed for wheezing or shortness of breath.  Dispense: 1 Inhaler; Refill: 1    I am having Ms. Luce maintain her simvastatin, triamterene-hydrochlorothiazide, SYNTHROID, ipratropium, CHERATUSSIN AC, PROAIR HFA, and benzonatate.   Meds ordered this encounter  Medications  . ipratropium (ATROVENT) 0.03 % nasal spray  . CHERATUSSIN AC 100-10 MG/5ML syrup  . PROAIR HFA 108 (90 Base) MCG/ACT inhaler  . benzonatate (TESSALON) 200 MG capsule    Return precautions given.   Risks, benefits, and alternatives of the medications and treatment plan prescribed today were discussed, and patient expressed understanding.   Education regarding symptom management and diagnosis given to patient on AVS.  Continue to follow with Crecencio Mc, MD for routine health maintenance.   Vanna Scotland and I agreed with plan.   Mable Paris, FNP

## 2016-07-25 ENCOUNTER — Encounter: Payer: Self-pay | Admitting: Family

## 2016-11-15 ENCOUNTER — Ambulatory Visit (INDEPENDENT_AMBULATORY_CARE_PROVIDER_SITE_OTHER): Payer: BLUE CROSS/BLUE SHIELD | Admitting: Internal Medicine

## 2016-11-15 ENCOUNTER — Other Ambulatory Visit (HOSPITAL_COMMUNITY)
Admission: RE | Admit: 2016-11-15 | Discharge: 2016-11-15 | Disposition: A | Discharge status: Home / Self Care | Payer: BLUE CROSS/BLUE SHIELD | Source: Ambulatory Visit | Attending: Internal Medicine | Admitting: Internal Medicine

## 2016-11-15 ENCOUNTER — Encounter: Payer: Self-pay | Admitting: Internal Medicine

## 2016-11-15 VITALS — BP 128/70 | HR 77 | Temp 97.8°F | Resp 15 | Ht 62.0 in | Wt 148.6 lb

## 2016-11-15 DIAGNOSIS — Z124 Encounter for screening for malignant neoplasm of cervix: Secondary | ICD-10-CM | POA: Insufficient documentation

## 2016-11-15 DIAGNOSIS — Z0000 Encounter for general adult medical examination without abnormal findings: Secondary | ICD-10-CM

## 2016-11-15 DIAGNOSIS — E034 Atrophy of thyroid (acquired): Secondary | ICD-10-CM

## 2016-11-15 DIAGNOSIS — I10 Essential (primary) hypertension: Secondary | ICD-10-CM | POA: Diagnosis not present

## 2016-11-15 DIAGNOSIS — Z1151 Encounter for screening for human papillomavirus (HPV): Secondary | ICD-10-CM | POA: Diagnosis not present

## 2016-11-15 DIAGNOSIS — E785 Hyperlipidemia, unspecified: Secondary | ICD-10-CM

## 2016-11-15 DIAGNOSIS — E559 Vitamin D deficiency, unspecified: Secondary | ICD-10-CM | POA: Diagnosis not present

## 2016-11-15 DIAGNOSIS — E663 Overweight: Secondary | ICD-10-CM

## 2016-11-15 DIAGNOSIS — E7800 Pure hypercholesterolemia, unspecified: Secondary | ICD-10-CM

## 2016-11-15 DIAGNOSIS — Z0001 Encounter for general adult medical examination with abnormal findings: Secondary | ICD-10-CM

## 2016-11-15 LAB — COMPREHENSIVE METABOLIC PANEL
ALK PHOS: 70 U/L (ref 39–117)
ALT: 16 U/L (ref 0–35)
AST: 20 U/L (ref 0–37)
Albumin: 4.5 g/dL (ref 3.5–5.2)
BUN: 15 mg/dL (ref 6–23)
CHLORIDE: 99 meq/L (ref 96–112)
CO2: 29 meq/L (ref 19–32)
Calcium: 9.7 mg/dL (ref 8.4–10.5)
Creatinine, Ser: 0.72 mg/dL (ref 0.40–1.20)
GFR: 88.23 mL/min (ref >60.00)
GLUCOSE: 104 mg/dL — AB (ref 70–99)
POTASSIUM: 3.9 meq/L (ref 3.5–5.1)
SODIUM: 134 meq/L — AB (ref 135–145)
TOTAL PROTEIN: 7.8 g/dL (ref 6.0–8.3)
Total Bilirubin: 0.8 mg/dL (ref 0.2–1.2)

## 2016-11-15 LAB — LDL CHOLESTEROL, DIRECT: LDL DIRECT: 89 mg/dL

## 2016-11-15 LAB — LIPID PANEL
CHOL/HDL RATIO: 5
Cholesterol: 168 mg/dL (ref 0–200)
HDL: 34.3 mg/dL — AB (ref >39.00)
NONHDL: 133.94
TRIGLYCERIDES: 352 mg/dL — AB (ref 0.0–149.0)
VLDL: 70.4 mg/dL — AB (ref 0.0–40.0)

## 2016-11-15 LAB — TSH: TSH: 2.42 u[IU]/mL (ref 0.35–4.50)

## 2016-11-15 LAB — VITAMIN D 25 HYDROXY (VIT D DEFICIENCY, FRACTURES): VITD: 25.41 ng/mL — ABNORMAL LOW (ref 30.00–100.00)

## 2016-11-15 NOTE — Patient Instructions (Addendum)
Your goal weight for BMI < 25 is 130 lbs     To make a low carb chip :  Take the Joseph's Lavash or Pita bread,  Or the [place] Low carb whole wheat tortilla   Place on metal cookie sheet  Brush with olive oil  Sprinkle garlic powder (NOT garlic salt), grated parmesan cheese, mediterranean seasoning , or all of them?  Bake at 275 for 30 minutes   We have substitutions for your potatoes!!  Try the mashed cauliflower and riced cauliflower dishes instead of rice and mashed potatoes  Mashed turnips are also very low carb!   For desserts :  Try the Dannon Lt n Fit greek yogurt dessert flavors and top with reddi Whip .  8 carbs,  80 calories  Try Oikos Triple Zero [place] Yogurt in the salted caramel, and the coffee flavors  With Whipped Cream for dessert  breyer's low carb ice cream, available in bars (on a stick, better ) or scoopable ice cream  HERE ARE THE LOW CARB  BREAD CHOICES

## 2016-11-15 NOTE — Progress Notes (Signed)
Patient ID: Kaitlyn Cortez, female    DOB: [birth year]  Age: 56 y.o. MRN: [id number]  The patient is here for preventive health  examination and management of other chronic and acute problems.   The risk factors are reflected in the social history.  The roster of all physicians providing medical care to patient - is listed in the Snapshot section of the chart.  Activities of daily living:  The patient is 100% independent in all ADLs: dressing, toileting, feeding as well as independent mobility  Home safety : The patient has smoke detectors in the home. They wear seatbelts.  There are no firearms at home. There is no violence in the home.   There is no risks for hepatitis, STDs or HIV. There is no   history of blood transfusion. They have no travel history to infectious disease endemic areas of the world.  The patient has seen their dentist in the last six month. They have seen their eye doctor in the last year. T   Discussed the need for sun protection: hats, long sleeves and use of sunscreen if there is significant sun exposure.   Diet: the importance of a healthy diet is discussed. They do have a healthy diet.  The benefits of regular aerobic exercise were discussed. She walks 4 times per week ,  20 minutes.   Depression screen: there are no signs or vegative symptoms of depression- irritability, change in appetite, anhedonia, sadness/tearfullness.  The following portions of the patient's history were reviewed and updated as appropriate: allergies, current medications, past family history, past medical history,  past surgical history, past social history  and problem list.  Visual acuity was not assessed per patient preference since she has regular follow up with her ophthalmologist. Hearing and body mass index were assessed and reviewed.   During the course of the visit the patient was educated and counseled about appropriate screening and preventive services including : fall prevention  , diabetes screening, nutrition counseling, colorectal cancer screening, and recommended immunizations.    CC: The primary encounter diagnosis was Hypothyroidism due to acquired atrophy of thyroid. Diagnoses of Essential hypertension, Pure hypercholesterolemia, Vitamin D deficiency, Cervical cancer screening, Visit for preventive health examination, and Overweight (BMI 25.0-29.9) were also pertinent to this visit.  History Kaitlyn Cortez has a past medical history of Anemia; Diffuse cystic mastopathy (2011); Hyperlipidemia; Hypertension; hypothyroidism; Menopausal menorrhagia (2009); Overweight (BMI 25.0-29.9); and Unspecified disorder of thyroid (2011).   She has a past surgical history that includes Biopsy thyroid (2010); Tubal ligation; Breast biopsy (2011); Hysteroscopy (2009); and Colonoscopy (2011).   Her family history includes CAD (age of onset: 37) in her mother; Heart disease in her maternal uncle; Heart disease (age of onset: 53) in her father.She reports that she has never smoked. She has never used smokeless tobacco. She reports that she drinks alcohol. She reports that she does not use drugs.  Outpatient Medications Prior to Visit  Medication Sig Dispense Refill  . simvastatin (ZOCOR) 20 MG tablet TAKE 1 TABLET (20 MG TOTAL) BY MOUTH EVERY EVENING. 30 tablet 11  . SYNTHROID 75 MCG tablet TAKE 1 TABLET (75 MCG TOTAL) BY MOUTH DAILY. 30 tablet 10  . triamterene-hydrochlorothiazide (MAXZIDE-25) 37.5-25 MG tablet TAKE 1 TABLET BY MOUTH DAILY. 30 tablet 11  . azithromycin (ZITHROMAX) 250 MG tablet Tale 500 mg PO on day 1, then 250 mg PO q24h x 4 days. (Patient not taking: Reported on 11/15/2016) 6 tablet 0  . benzonatate (TESSALON) 200 MG capsule     .  CHERATUSSIN AC 100-10 MG/5ML syrup     . ipratropium (ATROVENT) 0.03 % nasal spray     . PROAIR HFA 108 (90 Base) MCG/ACT inhaler Inhale 1 puff into the lungs every 6 (six) hours as needed for wheezing or shortness of breath. (Patient not taking:  Reported on 11/15/2016) 1 Inhaler 1   No facility-administered medications prior to visit.     Review of Systems   Patient denies headache, fevers, malaise, unintentional weight loss, skin rash, eye pain, sinus congestion and sinus pain, sore throat, dysphagia,  hemoptysis , cough, dyspnea, wheezing, chest pain, palpitations, orthopnea, edema, abdominal pain, nausea, melena, diarrhea, constipation, flank pain, dysuria, hematuria, urinary  Frequency, nocturia, numbness, tingling, seizures,  Focal weakness, Loss of consciousness,  Tremor, insomnia, depression, anxiety, and suicidal ideation.      Objective:  BP 128/70 (BP Location: Left Arm, Patient Position: Sitting, Cuff Size: Normal)   Pulse 77   Temp 97.8 F (36.6 C) (Oral)   Resp 15   Ht 5\' 2"  (1.575 m)   Wt 148 lb 9.6 oz (67.4 kg)   SpO2 97%   BMI 27.18 kg/m   Physical Exam   .BP 128/70 (BP Location: Left Arm, Patient Position: Sitting, Cuff Size: Normal)   Pulse 77   Temp 97.8 F (36.6 C) (Oral)   Resp 15   Ht 5\' 2"  (1.575 m)   Wt 148 lb 9.6 oz (67.4 kg)   SpO2 97%   BMI 27.18 kg/m   General Appearance:    Alert, cooperative, no distress, appears stated age  Head:    Normocephalic, without obvious abnormality, atraumatic  Eyes:    PERRL, conjunctiva/corneas clear, EOM's intact, fundi    benign, both eyes  Ears:    Normal TM's and external ear canals, both ears  Nose:   Nares normal, septum midline, mucosa normal, no drainage    or sinus tenderness  Throat:   Lips, mucosa, and tongue normal; teeth and gums normal  Neck:   Supple, symmetrical, trachea midline, no adenopathy;    thyroid:  no enlargement/tenderness/nodules; no carotid   bruit or JVD  Back:     Symmetric, no curvature, ROM normal, no CVA tenderness  Lungs:     Clear to auscultation bilaterally, respirations unlabored  Chest Wall:    No tenderness or deformity   Heart:    Regular rate and rhythm, S1 and S2 normal, no murmur, rub   or gallop  Breast  Exam:    No tenderness, masses, or nipple abnormality  Abdomen:     Soft, non-tender, bowel sounds active all four quadrants,    no masses, no organomegaly  Genitalia:    Pelvic: cervix normal in appearance, external genitalia normal, no adnexal masses or tenderness, no cervical motion tenderness, rectovaginal septum normal, uterus normal size, shape, and consistency and vagina normal without discharge  Extremities:   Extremities normal, atraumatic, no cyanosis or edema  Pulses:   2+ and symmetric all extremities  Skin:   Skin color, texture, turgor normal, no rashes or lesions  Lymph nodes:   Cervical, supraclavicular, and axillary nodes normal  Neurologic:   CNII-XII intact, normal strength, sensation and reflexes    throughout    Assessment & Plan:   Problem List Items Addressed This Visit    Cervical cancer screening    PAP smear was done today.       Relevant Orders   Cytology - PAP   Hyperlipidemia    Triglycerides are elevated today  to over 300.  Recommended to continue simvastatin,  Follow a low glycemic index diet, weight  loss and regular exercise.  Advised t o  repeat in 6 months.   Lab Results  Component Value Date   CHOL 223* 03/16/2014   HDL 52.20 03/16/2014   LDLDIRECT 109.0 03/16/2014   TRIG 343.0* 03/16/2014   CHOLHDL 4 03/16/2014   Lab Results  Component Value Date   ALT 20 03/16/2014   AST 24 03/16/2014   ALKPHOS 73 03/16/2014   BILITOT 0.4 03/16/2014           Relevant Orders   Lipid panel (Completed)   Hypertension (Chronic)    Well controlled on current regimen if triamterene hctz. Renal function stable, no changes today.  Lab Results  Component Value Date   CREATININE 0.72 11/15/2016   Lab Results  Component Value Date   NA 134 (L) 11/15/2016   K 3.9 11/15/2016   CL 99 11/15/2016   CO2 29 11/15/2016         Relevant Orders   Comprehensive metabolic panel (Completed)   Hypothyroidism - Primary    Thyroid function is WNL on  current dose.  No current changes needed.   Lab Results  Component Value Date   TSH 2.42 11/15/2016         Relevant Orders   TSH (Completed)   Overweight (BMI 25.0-29.9)    Discussed goal weight.  Low GI diet recommended       Visit for preventive health examination    Annual comprehensive preventive exam was done as well as an evaluation and management of chronic conditions .  During the course of the visit the patient was educated and counseled about appropriate screening and preventive services including :  diabetes screening, lipid analysis with projected  10 year  risk for CAD , nutrition counseling, breast, cervical and colorectal cancer screening, and recommended immunizations.  Printed recommendations for health maintenance screenings was given      Vitamin D deficiency   Relevant Orders   VITAMIN D 25 Hydroxy (Vit-D Deficiency, Fractures) (Completed)      I have discontinued Kaitlyn Cortez's ipratropium, CHERATUSSIN AC, benzonatate, azithromycin, and PROAIR HFA. I am also having her maintain her simvastatin, triamterene-hydrochlorothiazide, and SYNTHROID.  No orders of the defined types were placed in this encounter.   Medications Discontinued During This Encounter  Medication Reason  . azithromycin (ZITHROMAX) 250 MG tablet Completed Course  . benzonatate (TESSALON) 200 MG capsule Patient has not taken in last 30 days  . CHERATUSSIN AC 100-10 MG/5ML syrup Patient has not taken in last 30 days  . ipratropium (ATROVENT) 0.03 % nasal spray Patient has not taken in last 30 days  . PROAIR HFA 108 (90 Base) MCG/ACT inhaler Patient has not taken in last 30 days    Follow-up: No Follow-up on file.   Crecencio Mc, MD

## 2016-11-17 DIAGNOSIS — Z124 Encounter for screening for malignant neoplasm of cervix: Secondary | ICD-10-CM | POA: Insufficient documentation

## 2016-11-17 NOTE — Assessment & Plan Note (Signed)
Annual comprehensive preventive exam was done as well as an evaluation and management of chronic conditions .  During the course of the visit the patient was educated and counseled about appropriate screening and preventive services including :  diabetes screening, lipid analysis with projected  10 year  risk for CAD , nutrition counseling, breast, cervical and colorectal cancer screening, and recommended immunizations.  Printed recommendations for health maintenance screenings was given 

## 2016-11-17 NOTE — Assessment & Plan Note (Signed)
Discussed goal weight.  Low GI diet recommended

## 2016-11-17 NOTE — Assessment & Plan Note (Signed)
Thyroid function is WNL on current dose.  No current changes needed.   Lab Results  Component Value Date   TSH 2.42 11/15/2016

## 2016-11-17 NOTE — Assessment & Plan Note (Signed)
PAP smear was done today 

## 2016-11-17 NOTE — Assessment & Plan Note (Signed)
Well controlled on current regimen if triamterene hctz. Renal function stable, no changes today.  Lab Results  Component Value Date   CREATININE 0.72 11/15/2016   Lab Results  Component Value Date   NA 134 (L) 11/15/2016   K 3.9 11/15/2016   CL 99 11/15/2016   CO2 29 11/15/2016

## 2016-11-17 NOTE — Assessment & Plan Note (Addendum)
Triglycerides are elevated today to over 300.  Recommended to continue simvastatin,  Follow a low glycemic index diet, weight  loss and regular exercise.  Advised t o  repeat in 6 months.   Lab Results  Component Value Date   CHOL 223* 03/16/2014   HDL 52.20 03/16/2014   LDLDIRECT 109.0 03/16/2014   TRIG 343.0* 03/16/2014   CHOLHDL 4 03/16/2014   Lab Results  Component Value Date   ALT 20 03/16/2014   AST 24 03/16/2014   ALKPHOS 73 03/16/2014   BILITOT 0.4 03/16/2014

## 2016-11-19 ENCOUNTER — Telehealth: Payer: Self-pay | Admitting: Internal Medicine

## 2016-11-19 LAB — CYTOLOGY - PAP
Diagnosis: NEGATIVE
HPV (WINDOPATH): NOT DETECTED

## 2016-11-19 NOTE — Telephone Encounter (Signed)
I am pleased to inform you that your PAP smear was normal,  And your  HPV screen was negative.  We will repeat in 3 years    Dr Emberlee Sortino 

## 2016-11-20 NOTE — Telephone Encounter (Signed)
Spoke with pt and informed her of her pap results. Pt gave a verbal understanding.

## 2016-12-06 ENCOUNTER — Other Ambulatory Visit: Payer: Self-pay | Admitting: Internal Medicine

## 2017-05-12 ENCOUNTER — Other Ambulatory Visit: Payer: Self-pay | Admitting: General Surgery

## 2017-05-13 ENCOUNTER — Other Ambulatory Visit: Payer: Self-pay | Admitting: Internal Medicine

## 2017-05-13 MED ORDER — SYNTHROID 75 MCG PO TABS: 75.0000 ug | ORAL_TABLET | Freq: Every day | ORAL | 10 refills | Status: DC

## 2017-05-13 NOTE — Progress Notes (Signed)
REfills on Synthroid 75 mcg sent.

## 2017-07-30 LAB — HM MAMMOGRAPHY

## 2017-08-29 ENCOUNTER — Encounter: Payer: Self-pay | Admitting: Internal Medicine

## 2017-11-21 ENCOUNTER — Encounter: Payer: Self-pay | Admitting: Internal Medicine

## 2017-11-21 ENCOUNTER — Ambulatory Visit (INDEPENDENT_AMBULATORY_CARE_PROVIDER_SITE_OTHER): Payer: BLUE CROSS/BLUE SHIELD | Admitting: Internal Medicine

## 2017-11-21 VITALS — BP 122/74 | HR 85 | Temp 98.3°F | Resp 15 | Ht <= 58 in | Wt 145.5 lb

## 2017-11-21 DIAGNOSIS — N6019 Diffuse cystic mastopathy of unspecified breast: Secondary | ICD-10-CM | POA: Diagnosis not present

## 2017-11-21 DIAGNOSIS — E785 Hyperlipidemia, unspecified: Secondary | ICD-10-CM

## 2017-11-21 DIAGNOSIS — E7800 Pure hypercholesterolemia, unspecified: Secondary | ICD-10-CM

## 2017-11-21 DIAGNOSIS — R5383 Other fatigue: Secondary | ICD-10-CM

## 2017-11-21 DIAGNOSIS — Z0000 Encounter for general adult medical examination without abnormal findings: Secondary | ICD-10-CM | POA: Diagnosis not present

## 2017-11-21 DIAGNOSIS — I10 Essential (primary) hypertension: Secondary | ICD-10-CM

## 2017-11-21 DIAGNOSIS — L578 Other skin changes due to chronic exposure to nonionizing radiation: Secondary | ICD-10-CM | POA: Diagnosis not present

## 2017-11-21 DIAGNOSIS — E034 Atrophy of thyroid (acquired): Secondary | ICD-10-CM

## 2017-11-21 LAB — CBC WITH DIFFERENTIAL/PLATELET
BASOS ABS: 0.1 10*3/uL (ref 0.0–0.1)
Basophils Relative: 0.8 % (ref 0.0–3.0)
Eosinophils Absolute: 0.2 10*3/uL (ref 0.0–0.7)
Eosinophils Relative: 3.4 % (ref 0.0–5.0)
HCT: 42.5 % (ref 36.0–46.0)
HEMOGLOBIN: 14.9 g/dL (ref 12.0–15.0)
Lymphocytes Relative: 28.8 % (ref 12.0–46.0)
Lymphs Abs: 1.9 10*3/uL (ref 0.7–4.0)
MCHC: 35.1 g/dL (ref 30.0–36.0)
MCV: 87.3 fl (ref 78.0–100.0)
Monocytes Absolute: 0.6 10*3/uL (ref 0.1–1.0)
Monocytes Relative: 9.6 % (ref 3.0–12.0)
NEUTROS PCT: 57.4 % (ref 43.0–77.0)
Neutro Abs: 3.7 10*3/uL (ref 1.4–7.7)
Platelets: 285 10*3/uL (ref 150.0–400.0)
RBC: 4.87 Mil/uL (ref 3.87–5.11)
RDW: 12.6 % (ref 11.5–15.5)
WBC: 6.4 10*3/uL (ref 4.0–10.5)

## 2017-11-21 LAB — COMPREHENSIVE METABOLIC PANEL
ALK PHOS: 72 U/L (ref 39–117)
ALT: 16 U/L (ref 0–35)
AST: 19 U/L (ref 0–37)
Albumin: 4.5 g/dL (ref 3.5–5.2)
BUN: 17 mg/dL (ref 6–23)
CHLORIDE: 102 meq/L (ref 96–112)
CO2: 28 meq/L (ref 19–32)
CREATININE: 0.75 mg/dL (ref 0.40–1.20)
Calcium: 9.8 mg/dL (ref 8.4–10.5)
GFR: 83.88 mL/min (ref >60.00)
GLUCOSE: 91 mg/dL (ref 70–99)
Potassium: 4.1 mEq/L (ref 3.5–5.1)
SODIUM: 139 meq/L (ref 135–145)
Total Bilirubin: 0.8 mg/dL (ref 0.2–1.2)
Total Protein: 7.9 g/dL (ref 6.0–8.3)

## 2017-11-21 LAB — LIPID PANEL
CHOLESTEROL: 145 mg/dL (ref 0–200)
HDL: 36.7 mg/dL — ABNORMAL LOW (ref >39.00)
LDL Cholesterol: 71 mg/dL (ref 0–99)
NONHDL: 108.44
Total CHOL/HDL Ratio: 4
Triglycerides: 187 mg/dL — ABNORMAL HIGH (ref 0.0–149.0)
VLDL: 37.4 mg/dL (ref 0.0–40.0)

## 2017-11-21 LAB — LDL CHOLESTEROL, DIRECT: LDL DIRECT: 80 mg/dL

## 2017-11-21 LAB — TSH: TSH: 1.58 u[IU]/mL (ref 0.35–4.50)

## 2017-11-21 MED ORDER — ZOSTER VAC RECOMB ADJUVANTED 50 MCG/0.5ML IM SUSR: 0.5000 mL | Freq: Once | INTRAMUSCULAR | 1 refills | Status: AC

## 2017-11-21 NOTE — Progress Notes (Signed)
Patient ID: Kaitlyn Cortez, female    DOB: 1960-07-13  Age: 57 y.o. MRN: [id number]  The patient is here for annual PREVENTIVE examination and management of other chronic and acute problems.  Last seen Sept 2018 one year ago  mammogram June 2019 normal  Declines flu vaccine,  Works from home.     The risk factors are reflected in the social history.  The roster of all physicians providing medical care to patient - is listed in the Snapshot section of the chart.  Activities of daily living:  The patient is 100% independent in all ADLs: dressing, toileting, feeding as well as independent mobility  Home safety : The patient has smoke detectors in the home. They wear seatbelts.  There are no firearms at home. There is no violence in the home.   There is no risks for hepatitis, STDs or HIV. There is no   history of blood transfusion. They have no travel history to infectious disease endemic areas of the world.  The patient has seen their dentist in the last six month. They have seen their eye doctor in the last year. They deny any  hearing difficulty .  They do have a history of  excessive sun exposure. Discussed the need for dermatology evaluation as well as sun protection: hats, long sleeves and use of sunscreen if there is significant sun exposure.   Diet: the importance of a healthy diet is discussed. They do have a healthy diet.  The benefits of regular aerobic exercise were discussed. She walks 4 times per week ,  20 minutes.   Depression screen: there are no signs or vegative symptoms of depression- irritability, change in appetite, anhedonia, sadness/tearfullness.  The following portions of the patient's history were reviewed and updated as appropriate: allergies, current medications, past family history, past medical history,  past surgical history, past social history  and problem list.  Visual acuity was not assessed per patient preference since she has regular follow up with her  ophthalmologist. Hearing and body mass index were assessed and reviewed.   During the course of the visit the patient was educated and counseled about appropriate screening and preventive services including : fall prevention , diabetes screening, nutrition counseling, colorectal cancer screening, and recommended immunizations.    CC: There were no encounter diagnoses.  History Shimika has a past medical history of Anemia, Diffuse cystic mastopathy (2011), Hyperlipidemia, Hypertension, hypothyroidism, Menopausal menorrhagia (2009), Overweight (BMI 25.0-29.9), and Unspecified disorder of thyroid (2011).   She has a past surgical history that includes Biopsy thyroid (2010); Tubal ligation; Breast biopsy (2011); Hysteroscopy (2009); and Colonoscopy (2011).   Her family history includes CAD (age of onset: 33) in her mother; Heart disease in her maternal uncle; Heart disease (age of onset: 23) in her father.She reports that she has never smoked. She has never used smokeless tobacco. She reports that she drinks alcohol. She reports that she does not use drugs.  Outpatient Medications Prior to Visit  Medication Sig Dispense Refill  . simvastatin (ZOCOR) 20 MG tablet TAKE 1 TABLET (20 MG TOTAL) BY MOUTH EVERY EVENING. 30 tablet 10  . SYNTHROID 75 MCG tablet Take 1 tablet (75 mcg total) by mouth daily. 30 tablet 10  . triamterene-hydrochlorothiazide (MAXZIDE-25) 37.5-25 MG tablet TAKE 1 TABLET BY MOUTH DAILY. 30 tablet 10   No facility-administered medications prior to visit.     Review of Systems   Patient denies headache, fevers, malaise, unintentional weight loss, skin rash, eye pain, sinus  congestion and sinus pain, sore throat, dysphagia,  hemoptysis , cough, dyspnea, wheezing, chest pain, palpitations, orthopnea, edema, abdominal pain, nausea, melena, diarrhea, constipation, flank pain, dysuria, hematuria, urinary  Frequency, nocturia, numbness, tingling, seizures,  Focal weakness, Loss of  consciousness,  Tremor, insomnia, depression, anxiety, and suicidal ideation.      Objective:  BP 122/74 (BP Location: Left Arm, Patient Position: Sitting, Cuff Size: Normal)   Pulse 85   Temp 98.3 F (36.8 C) (Oral)   Resp 15   Ht 4' 9.75" (1.467 m)   Wt 145 lb 8 oz (66 kg)   SpO2 98%   BMI 30.67 kg/m   Physical Exam   General appearance: alert, cooperative and appears stated age Head: Normocephalic, without obvious abnormality, atraumatic Eyes: conjunctivae/corneas clear. PERRL, EOM's intact. Fundi benign. Ears: normal TM's and external ear canals both ears Nose: Nares normal. Septum midline. Mucosa normal. No drainage or sinus tenderness. Throat: lips, mucosa, and tongue normal; teeth and gums normal Neck: no adenopathy, no carotid bruit, no JVD, supple, symmetrical, trachea midline and thyroid not enlarged, symmetric, no tenderness/mass/nodules Lungs: clear to auscultation bilaterally Breasts: normal appearance, no masses or tenderness Heart: regular rate and rhythm, S1, S2 normal, no murmur, click, rub or gallop Abdomen: soft, non-tender; bowel sounds normal; no masses,  no organomegaly Extremities: extremities normal, atraumatic, no cyanosis or edema Pulses: 2+ and symmetric Skin: Skin color, texture, turgor normal. No rashes or lesions Neurologic: Alert and oriented X 3, normal strength and tone. Normal symmetric reflexes. Normal coordination and gait.      Assessment & Plan:   Problem List Items Addressed This Visit    None      I am having Lezlie Octave. Petras maintain her simvastatin, triamterene-hydrochlorothiazide, and SYNTHROID.  No orders of the defined types were placed in this encounter.   There are no discontinued medications.  Follow-up: No follow-ups on file.   Crecencio Mc, MD

## 2017-11-21 NOTE — Patient Instructions (Addendum)
Dry eye may be causing your blurry vision at the end of the day,  Caused by too little blinking because of work on a lap top.  Try Refresh or systane eye drops  I recommend taking 2000 Ius of Vitamin D daily,  And increasing dose to 4000 Ius during winter months  You also need 1200 mg calcium daily,  Through diet and supplements.   Let me know if you want a referral fora formal hearing evaluation through [facility]   The ShingRx vaccine is now available in local pharmacies and is much more protective thant Zostavaxs,  It is therefore ADVISED for all interested adults over 50 to prevent shingles    Health Maintenance for Postmenopausal Women Menopause is a normal process in which your reproductive ability comes to an end. This process happens gradually over a span of months to years, usually between the ages of 44 and 5. Menopause is complete when you have missed 12 consecutive menstrual periods. It is important to talk with your health care provider about some of the most common conditions that affect postmenopausal women, such as heart disease, cancer, and bone loss (osteoporosis). Adopting a healthy lifestyle and getting preventive care can help to promote your health and wellness. Those actions can also lower your chances of developing some of these common conditions. What should I know about menopause? During menopause, you may experience a number of symptoms, such as:  Moderate-to-severe hot flashes.  Night sweats.  Decrease in sex drive.  Mood swings.  Headaches.  Tiredness.  Irritability.  Memory problems.  Insomnia.  Choosing to treat or not to treat menopausal changes is an individual decision that you make with your health care provider. What should I know about hormone replacement therapy and supplements? Hormone therapy products are effective for treating symptoms that are associated with menopause, such as hot flashes and night sweats. Hormone replacement  carries certain risks, especially as you become older. If you are thinking about using estrogen or estrogen with progestin treatments, discuss the benefits and risks with your health care provider. What should I know about heart disease and stroke? Heart disease, heart attack, and stroke become more likely as you age. This may be due, in part, to the hormonal changes that your body experiences during menopause. These can affect how your body processes dietary fats, triglycerides, and cholesterol. Heart attack and stroke are both medical emergencies. There are many things that you can do to help prevent heart disease and stroke:  Have your blood pressure checked at least every 1-2 years. High blood pressure causes heart disease and increases the risk of stroke.  If you are 61-57 years old, ask your health care provider if you should take aspirin to prevent a heart attack or a stroke.  Do not use any tobacco products, including cigarettes, chewing tobacco, or electronic cigarettes. If you need help quitting, ask your health care provider.  It is important to eat a healthy diet and maintain a healthy weight. ? Be sure to include plenty of vegetables, fruits, low-fat dairy products, and lean protein. ? Avoid eating foods that are high in solid fats, added sugars, or salt (sodium).  Get regular exercise. This is one of the most important things that you can do for your health. ? Try to exercise for at least 150 minutes each week. The type of exercise that you do should increase your heart rate and make you sweat. This is known as moderate-intensity exercise. ? Try to do  strengthening exercises at least twice each week. Do these in addition to the moderate-intensity exercise.  Know your numbers.Ask your health care provider to check your cholesterol and your blood glucose. Continue to have your blood tested as directed by your health care provider.  What should I know about cancer screening? There  are several types of cancer. Take the following steps to reduce your risk and to catch any cancer development as early as possible. Breast Cancer  Practice breast self-awareness. ? This means understanding how your breasts normally appear and feel. ? It also means doing regular breast self-exams. Let your health care provider know about any changes, no matter how small.  If you are 28 or older, have a clinician do a breast exam (clinical breast exam or CBE) every year. Depending on your age, family history, and medical history, it may be recommended that you also have a yearly breast X-ray (mammogram).  If you have a family history of breast cancer, talk with your health care provider about genetic screening.  If you are at high risk for breast cancer, talk with your health care provider about having an MRI and a mammogram every year.  Breast cancer (BRCA) gene test is recommended for women who have family members with BRCA-related cancers. Results of the assessment will determine the need for genetic counseling and BRCA1 and for BRCA2 testing. BRCA-related cancers include these types: ? Breast. This occurs in males or females. ? Ovarian. ? Tubal. This may also be called fallopian tube cancer. ? Cancer of the abdominal or pelvic lining (peritoneal cancer). ? Prostate. ? Pancreatic.  Cervical, Uterine, and Ovarian Cancer Your health care provider may recommend that you be screened regularly for cancer of the pelvic organs. These include your ovaries, uterus, and vagina. This screening involves a pelvic exam, which includes checking for microscopic changes to the surface of your cervix (Pap test).  For women ages 21-65, health care providers may recommend a pelvic exam and a Pap test every three years. For women ages 67-65, they may recommend the Pap test and pelvic exam, combined with testing for human papilloma virus (HPV), every five years. Some types of HPV increase your risk of cervical  cancer. Testing for HPV may also be done on women of any age who have unclear Pap test results.  Other health care providers may not recommend any screening for nonpregnant women who are considered low risk for pelvic cancer and have no symptoms. Ask your health care provider if a screening pelvic exam is right for you.  If you have had past treatment for cervical cancer or a condition that could lead to cancer, you need Pap tests and screening for cancer for at least 20 years after your treatment. If Pap tests have been discontinued for you, your risk factors (such as having a new sexual partner) need to be reassessed to determine if you should start having screenings again. Some women have medical problems that increase the chance of getting cervical cancer. In these cases, your health care provider may recommend that you have screening and Pap tests more often.  If you have a family history of uterine cancer or ovarian cancer, talk with your health care provider about genetic screening.  If you have vaginal bleeding after reaching menopause, tell your health care provider.  There are currently no reliable tests available to screen for ovarian cancer.  Lung Cancer Lung cancer screening is recommended for adults 61-82 years old who are at high risk  for lung cancer because of a history of smoking. A yearly low-dose CT scan of the lungs is recommended if you:  Currently smoke.  Have a history of at least 30 pack-years of smoking and you currently smoke or have quit within the past 15 years. A pack-year is smoking an average of one pack of cigarettes per day for one year.  Yearly screening should:  Continue until it has been 15 years since you quit.  Stop if you develop a health problem that would prevent you from having lung cancer treatment.  Colorectal Cancer  This type of cancer can be detected and can often be prevented.  Routine colorectal cancer screening usually begins at age 69  and continues through age 16.  If you have risk factors for colon cancer, your health care provider may recommend that you be screened at an earlier age.  If you have a family history of colorectal cancer, talk with your health care provider about genetic screening.  Your health care provider may also recommend using home test kits to check for hidden blood in your stool.  A small camera at the end of a tube can be used to examine your colon directly (sigmoidoscopy or colonoscopy). This is done to check for the earliest forms of colorectal cancer.  Direct examination of the colon should be repeated every 5-10 years until age 64. However, if early forms of precancerous polyps or small growths are found or if you have a family history or genetic risk for colorectal cancer, you may need to be screened more often.  Skin Cancer  Check your skin from head to toe regularly.  Monitor any moles. Be sure to tell your health care provider: ? About any new moles or changes in moles, especially if there is a change in a mole's shape or color. ? If you have a mole that is larger than the size of a pencil eraser.  If any of your family members has a history of skin cancer, especially at a young age, talk with your health care provider about genetic screening.  Always use sunscreen. Apply sunscreen liberally and repeatedly throughout the day.  Whenever you are outside, protect yourself by wearing long sleeves, pants, a wide-brimmed hat, and sunglasses.  What should I know about osteoporosis? Osteoporosis is a condition in which bone destruction happens more quickly than new bone creation. After menopause, you may be at an increased risk for osteoporosis. To help prevent osteoporosis or the bone fractures that can happen because of osteoporosis, the following is recommended:  If you are 36-93 years old, get at least 1,000 mg of calcium and at least 600 mg of vitamin D per day.  If you are older than  age 54 but younger than age 51, get at least 1,200 mg of calcium and at least 600 mg of vitamin D per day.  If you are older than age 45, get at least 1,200 mg of calcium and at least 800 mg of vitamin D per day.  Smoking and excessive alcohol intake increase the risk of osteoporosis. Eat foods that are rich in calcium and vitamin D, and do weight-bearing exercises several times each week as directed by your health care provider. What should I know about how menopause affects my mental health? Depression may occur at any age, but it is more common as you become older. Common symptoms of depression include:  Low or sad mood.  Changes in sleep patterns.  Changes in appetite or  eating patterns.  Feeling an overall lack of motivation or enjoyment of activities that you previously enjoyed.  Frequent crying spells.  Talk with your health care provider if you think that you are experiencing depression. What should I know about immunizations? It is important that you get and maintain your immunizations. These include:  Tetanus, diphtheria, and pertussis (Tdap) booster vaccine.  Influenza every year before the flu season begins.  Pneumonia vaccine.  Shingles vaccine.  Your health care provider may also recommend other immunizations. This information is not intended to replace advice given to you by your health care provider. Make sure you discuss any questions you have with your health care provider. Document Released: 03/29/2005 Document Revised: 08/25/2015 Document Reviewed: 11/08/2014 Elsevier Interactive Patient Education  2018 [employer].

## 2017-11-23 NOTE — Assessment & Plan Note (Signed)
Annual screening mammogram was normal in June .  breast exam was done today and no masses were appreciated.

## 2017-11-23 NOTE — Assessment & Plan Note (Addendum)
Annual comprehensive preventive exam was done as well as an evaluation and management of chronic conditions .  During the course of the visit the patient was educated and counseled about appropriate screening and preventive services including :  diabetes screening, lipid analysis with projected  10 year  risk for CAD , nutrition counseling, breast, cervical and colorectal cancer screening, and recommended immunizations.  Printed recommendations for health maintenance screenings was given.  Dermatology referral made to [facility]  Shingrx  rx given

## 2017-11-23 NOTE — Assessment & Plan Note (Signed)
Well controlled on current regimen of triamterene hctz. Renal function stable, no changes today.  Lab Results  Component Value Date   CREATININE 0.75 11/21/2017   Lab Results  Component Value Date   NA 139 11/21/2017   K 4.1 11/21/2017   CL 102 11/21/2017   CO2 28 11/21/2017

## 2017-11-23 NOTE — Assessment & Plan Note (Signed)
LDL and triglycerides are at goal on current simvastatin dose . she has no side effects and liver enzymes are normal. No changes today   Lab Results  Component Value Date   CHOL 145 11/21/2017   HDL 36.70 (L) 11/21/2017   LDLCALC 71 11/21/2017   LDLDIRECT 80.0 11/21/2017   TRIG 187.0 (H) 11/21/2017   CHOLHDL 4 11/21/2017   Lab Results  Component Value Date   ALT 16 11/21/2017   AST 19 11/21/2017   ALKPHOS 72 11/21/2017   BILITOT 0.8 11/21/2017

## 2017-11-23 NOTE — Assessment & Plan Note (Signed)
Thyroid function is WNL on current dose.  No current changes needed.   Lab Results  Component Value Date   TSH 1.58 11/21/2017

## 2018-01-02 ENCOUNTER — Other Ambulatory Visit: Payer: Self-pay | Admitting: Internal Medicine

## 2018-02-03 DIAGNOSIS — D2271 Melanocytic nevi of right lower limb, including hip: Secondary | ICD-10-CM | POA: Diagnosis not present

## 2018-02-03 DIAGNOSIS — D225 Melanocytic nevi of trunk: Secondary | ICD-10-CM | POA: Diagnosis not present

## 2018-02-03 DIAGNOSIS — L821 Other seborrheic keratosis: Secondary | ICD-10-CM | POA: Diagnosis not present

## 2018-02-03 DIAGNOSIS — D2272 Melanocytic nevi of left lower limb, including hip: Secondary | ICD-10-CM | POA: Diagnosis not present

## 2018-04-14 ENCOUNTER — Ambulatory Visit (INDEPENDENT_AMBULATORY_CARE_PROVIDER_SITE_OTHER): Payer: BLUE CROSS/BLUE SHIELD

## 2018-04-14 ENCOUNTER — Ambulatory Visit: Payer: Self-pay

## 2018-04-14 ENCOUNTER — Ambulatory Visit (INDEPENDENT_AMBULATORY_CARE_PROVIDER_SITE_OTHER): Payer: BLUE CROSS/BLUE SHIELD | Admitting: Internal Medicine

## 2018-04-14 ENCOUNTER — Encounter: Payer: Self-pay | Admitting: Internal Medicine

## 2018-04-14 VITALS — BP 128/66 | HR 71 | Temp 97.6°F | Ht <= 58 in | Wt 147.8 lb

## 2018-04-14 DIAGNOSIS — M25511 Pain in right shoulder: Secondary | ICD-10-CM

## 2018-04-14 MED ORDER — CYCLOBENZAPRINE HCL 5 MG PO TABS: 5.0000 mg | ORAL_TABLET | Freq: Every evening | ORAL | 0 refills | Status: DC | PRN

## 2018-04-14 NOTE — Patient Instructions (Addendum)
Use biofreeze, aspercream, bengay, or tiger balm right shoulder  [facility] or [facility] stretches to do for shoulder   Shoulder Exercises Ask your health care provider which exercises are safe for you. Do exercises exactly as told by your health care provider and adjust them as directed. It is normal to feel mild stretching, pulling, tightness, or discomfort as you do these exercises, but you should stop right away if you feel sudden pain or your pain gets worse.Do not begin these exercises until told by your health care provider. Range of Motion Exercises        These exercises warm up your muscles and joints and improve the movement and flexibility of your shoulder. These exercises also help to relieve pain, numbness, and tingling. These exercises involve stretching your injured shoulder directly. Exercise A: Pendulum 1. Stand near a wall or a surface that you can hold onto for balance. 2. Bend at the waist and let your left / right arm hang straight down. Use your other arm to support you. Keep your back straight and do not lock your knees. 3. Relax your left / right arm and shoulder muscles, and move your hips and your trunk so your left / right arm swings freely. Your arm should swing because of the motion of your body, not because you are using your arm or shoulder muscles. 4. Keep moving your body so your arm swings in the following directions, as told by your health care provider: ? Side to side. ? Forward and backward. ? In clockwise and counterclockwise circles. 5. Continue each motion for __________ seconds, or for as long as told by your health care provider. 6. Slowly return to the starting position. Repeat __________ times. Complete this exercise __________ times a day. Exercise B:Flexion, Standing 1. Stand and hold a broomstick, a cane, or a similar object. Place your hands a little more than shoulder-width apart on the object. Your left / right hand should be palm-up, and  your other hand should be palm-down. 2. Keep your elbow straight and keep your shoulder muscles relaxed. Push the stick down with your healthy arm to raise your left / right arm in front of your body, and then over your head until you feel a stretch in your shoulder. ? Avoid shrugging your shoulder while you raise your arm. Keep your shoulder blade tucked down toward the middle of your back. 3. Hold for __________ seconds. 4. Slowly return to the starting position. Repeat __________ times. Complete this exercise __________ times a day. Exercise C: Abduction, Standing 1. Stand and hold a broomstick, a cane, or a similar object. Place your hands a little more than shoulder-width apart on the object. Your left / right hand should be palm-up, and your other hand should be palm-down. 2. While keeping your elbow straight and your shoulder muscles relaxed, push the stick across your body toward your left / right side. Raise your left / right arm to the side of your body and then over your head until you feel a stretch in your shoulder. ? Do not raise your arm above shoulder height, unless your health care provider tells you to do that. ? Avoid shrugging your shoulder while you raise your arm. Keep your shoulder blade tucked down toward the middle of your back. 3. Hold for __________ seconds. 4. Slowly return to the starting position. Repeat __________ times. Complete this exercise __________ times a day. Exercise D:Internal Rotation 1. Place your left / right hand behind  your back, palm-up. 2. Use your other hand to dangle an exercise band, a towel, or a similar object over your shoulder. Grasp the band with your left / right hand so you are holding onto both ends. 3. Gently pull up on the band until you feel a stretch in the front of your left / right shoulder. ? Avoid shrugging your shoulder while you raise your arm. Keep your shoulder blade tucked down toward the middle of your back. 4. Hold for  __________ seconds. 5. Release the stretch by letting go of the band and lowering your hands. Repeat __________ times. Complete this exercise __________ times a day. Stretching Exercises  These exercises warm up your muscles and joints and improve the movement and flexibility of your shoulder. These exercises also help to relieve pain, numbness, and tingling. These exercises are done using your healthy shoulder to help stretch the muscles of your injured shoulder. Exercise E: Warehouse manager (External Rotation and Abduction) 1. Stand in a doorway with one of your feet slightly in front of the other. This is called a staggered stance. If you cannot reach your forearms to the door frame, stand facing a corner of a room. 2. Choose one of the following positions as told by your health care provider: ? Place your hands and forearms on the door frame above your head. ? Place your hands and forearms on the door frame at the height of your head. ? Place your hands on the door frame at the height of your elbows. 3. Slowly move your weight onto your front foot until you feel a stretch across your chest and in the front of your shoulders. Keep your head and chest upright and keep your abdominal muscles tight. 4. Hold for __________ seconds. 5. To release the stretch, shift your weight to your back foot. Repeat __________ times. Complete this stretch __________ times a day. Exercise F:Extension, Standing 1. Stand and hold a broomstick, a cane, or a similar object behind your back. ? Your hands should be a little wider than shoulder-width apart. ? Your palms should face away from your back. 2. Keeping your elbows straight and keeping your shoulder muscles relaxed, move the stick away from your body until you feel a stretch in your shoulder. ? Avoid shrugging your shoulders while you move the stick. Keep your shoulder blade tucked down toward the middle of your back. 3. Hold for __________  seconds. 4. Slowly return to the starting position. Repeat __________ times. Complete this exercise __________ times a day. Strengthening Exercises           These exercises build strength and endurance in your shoulder. Endurance is the ability to use your muscles for a long time, even after they get tired. Exercise G:External Rotation 1. Sit in a stable chair without armrests. 2. Secure an exercise band at elbow height on your left / right side. 3. Place a soft object, such as a folded towel or a small pillow, between your left / right upper arm and your body to move your elbow a few inches away (about 10 cm) from your side. 4. Hold the end of the band so it is tight and there is no slack. 5. Keeping your elbow pressed against the soft object, move your left / right forearm out, away from your abdomen. Keep your body steady so only your forearm moves. 6. Hold for __________ seconds. 7. Slowly return to the starting position. Repeat __________ times. Complete this exercise __________ times  a day. Exercise H:Shoulder Abduction 1. Sit in a stable chair without armrests, or stand. 2. Hold a __________ weight in your left / right hand, or hold an exercise band with both hands. 3. Start with your arms straight down and your left / right palm facing in, toward your body. 4. Slowly lift your left / right hand out to your side. Do not lift your hand above shoulder height unless your health care provider tells you that this is safe. ? Keep your arms straight. ? Avoid shrugging your shoulder while you do this movement. Keep your shoulder blade tucked down toward the middle of your back. 5. Hold for __________ seconds. 6. Slowly lower your arm, and return to the starting position. Repeat __________ times. Complete this exercise __________ times a day. Exercise I:Shoulder Extension 1. Sit in a stable chair without armrests, or stand. 2. Secure an exercise band to a stable object in front of  you where it is at shoulder height. 3. Hold one end of the exercise band in each hand. Your palms should face each other. 4. Straighten your elbows and lift your hands up to shoulder height. 5. Step back, away from the secured end of the exercise band, until the band is tight and there is no slack. 6. Squeeze your shoulder blades together as you pull your hands down to the sides of your thighs. Stop when your hands are straight down by your sides. Do not let your hands go behind your body. 7. Hold for __________ seconds. 8. Slowly return to the starting position. Repeat __________ times. Complete this exercise __________ times a day. Exercise J:Standing Shoulder Row 1. Sit in a stable chair without armrests, or stand. 2. Secure an exercise band to a stable object in front of you so it is at waist height. 3. Hold one end of the exercise band in each hand. Your palms should be in a thumbs-up position. 4. Bend each of your elbows to an "L" shape (about 90 degrees) and keep your upper arms at your sides. 5. Step back until the band is tight and there is no slack. 6. Slowly pull your elbows back behind you. 7. Hold for __________ seconds. 8. Slowly return to the starting position. Repeat __________ times. Complete this exercise __________ times a day. Exercise K:Shoulder Press-Ups 1. Sit in a stable chair that has armrests. Sit upright, with your feet flat on the floor. 2. Put your hands on the armrests so your elbows are bent and your fingers are pointing forward. Your hands should be about even with the sides of your body. 3. Push down on the armrests and use your arms to lift yourself off of the chair. Straighten your elbows and lift yourself up as much as you comfortably can. ? Move your shoulder blades down, and avoid letting your shoulders move up toward your ears. ? Keep your feet on the ground. As you get stronger, your feet should support less of your body weight as you lift yourself  up. 4. Hold for __________ seconds. 5. Slowly lower yourself back into the chair. Repeat __________ times. Complete this exercise __________ times a day. Exercise L: Wall Push-Ups 1. Stand so you are facing a stable wall. Your feet should be about one arm-length away from the wall. 2. Lean forward and place your palms on the wall at shoulder height. 3. Keep your feet flat on the floor as you bend your elbows and lean forward toward the wall. 4. Hold for  __________ seconds. 5. Straighten your elbows to push yourself back to the starting position. Repeat __________ times. Complete this exercise __________ times a day. This information is not intended to replace advice given to you by your health care provider. Make sure you discuss any questions you have with your health care provider. Document Released: 12/19/2004 Document Revised: 06/10/2017 Document Reviewed: 10/16/2014 Elsevier Interactive Patient Education  2019 [place].   Shoulder Pain Many things can cause shoulder pain, including:  An injury to the shoulder.  Overuse of the shoulder.  Arthritis. The source of the pain can be:  Inflammation.  An injury to the shoulder joint.  An injury to a tendon, ligament, or bone. Follow these instructions at home: Pay attention to changes in your symptoms. Let your health care provider know about them. Follow these instructions to relieve your pain. If you have a sling:  Wear the sling as told by your health care provider. Remove it only as told by your health care provider.  Loosen the sling if your fingers tingle, become numb, or turn cold and blue.  Keep the sling clean.  If the sling is not waterproof: ? Do not let it get wet. Remove it to shower or bathe.  Move your arm as little as possible, but keep your hand moving to prevent swelling. Managing pain, stiffness, and swelling   If directed, put ice on the painful area: ? Put ice in a plastic bag. ? Place a towel  between your skin and the bag. ? Leave the ice on for 20 minutes, 2-3 times per day. Stop applying ice if it does not help with the pain.  Squeeze a soft ball or a foam pad as much as possible. This helps to keep the shoulder from swelling. It also helps to strengthen the arm. General instructions  Take over-the-counter and prescription medicines only as told by your health care provider.  Keep all follow-up visits as told by your health care provider. This is important. Contact a health care provider if:  Your pain gets worse.  Your pain is not relieved with medicines.  New pain develops in your arm, hand, or fingers. Get help right away if:  Your arm, hand, or fingers: ? Tingle. ? Become numb. ? Become swollen. ? Become painful. ? Turn white or blue. Summary  Shoulder pain can be caused by an injury, overuse, or arthritis.  Pay attention to changes in your symptoms. Let your health care provider know about them.  This condition may be treated with a sling, ice, and pain medicines.  Contact your health care provider if the pain gets worse or new pain develops. Get help right away if your arm, hand, or fingers tingle or become numb, swollen, or painful.  Keep all follow-up visits as told by your health care provider. This is important. This information is not intended to replace advice given to you by your health care provider. Make sure you discuss any questions you have with your health care provider. Document Released: 11/14/2004 Document Revised: 08/19/2017 Document Reviewed: 08/19/2017 Elsevier Interactive Patient Education  2019 [employer].

## 2018-04-14 NOTE — Telephone Encounter (Signed)
Pt c/o 3 days of right breast tenderness. Pt denies fever, breast pain, redness or rash or nipple discharge.  Pt stated she has a h/o benign cysts and dense breast.  Pt stated she is going out of town next week and wants to be checked out.  Care advice given and pt verbalized understanding.  No appts with PCP today. Pt would like to be seen. Pt given appt with Dr Ethelene Hal at 11:30 am.  Reason for Disposition . [1] Breast pain AND [2] cause is not known  Answer Assessment - Initial Assessment Questions 1. SYMPTOM: "What's the main symptom you're concerned about?"  (e.g., lump, pain, rash, nipple discharge)     tenderness 2. LOCATION: "Where is the tenderness located?"     Right breast  3. ONSET: "When did tenderness  start?"     Few days ago 4. PRIOR HISTORY: "Do you have any history of prior problems with your breasts?" (e.g., lumps, cancer, fibrocystic breast disease)     Benign cysts, dense breast 5. CAUSE: "What do you think is causing this symptom?"     Spot in back that hurts and does not know if could have pulled something 6. OTHER SYMPTOMS: "Do you have any other symptoms?" (e.g., fever, breast pain, redness or rash, nipple discharge)     no 7. PREGNANCY-BREASTFEEDING: "Is there any chance you are pregnant?" "When was your last menstrual period?" "Are you breastfeeding?"     n/a  Protocols used: BREAST [facility]

## 2018-04-14 NOTE — Progress Notes (Signed)
Chief Complaint  Patient presents with  . Shoulder Pain    right   Acute visit  1. C/o right shoulder pain and right upper abck pain x few days lifted grankid 30 lbs not sure if this did it pain is tender 5/10 toady nothing tried she wanted to get it checked out denies breast pain  2. HTN controlled today on mazxide 37.5-25 mg qd  Review of Systems  Constitutional: Negative for weight loss.  HENT: Negative for hearing loss.   Eyes: Negative for blurred vision.  Cardiovascular: Negative for chest pain.  Musculoskeletal: Positive for back pain and joint pain.   Past Medical History:  Diagnosis Date  . Anemia   . Diffuse cystic mastopathy 2011  . Hyperlipidemia   . Hypertension   . hypothyroidism   . Menopausal menorrhagia 2009   hysterosocopy was normal,  menses ended 2009  . Overweight (BMI 25.0-29.9)   . Unspecified disorder of thyroid 2011   Past Surgical History:  Procedure Laterality Date  . BIOPSY THYROID  2010   normal, Sankar  takes low dose synthroid for suppression  . BREAST BIOPSY  2011   fibrocystic breast disease  . COLONOSCOPY  2011  . HYSTEROSCOPY  2009  . TUBAL LIGATION     Family History  Problem Relation Age of Onset  . Heart disease Father 82  . Heart disease Maternal Uncle   . CAD Mother 56   Social History   Socioeconomic History  . Marital status: Married    Spouse name: Not on file  . Number of children: Not on file  . Years of education: Not on file  . Highest education level: Not on file  Occupational History  . Not on file  Social Needs  . Financial resource strain: Not on file  . Food insecurity:    Worry: Not on file    Inability: Not on file  . Transportation needs:    Medical: Not on file    Non-medical: Not on file  Tobacco Use  . Smoking status: Never Smoker  . Smokeless tobacco: Never Used  Substance and Sexual Activity  . Alcohol use: Yes  . Drug use: No  . Sexual activity: Not on file  Lifestyle  . Physical  activity:    Days per week: Not on file    Minutes per session: Not on file  . Stress: Not on file  Relationships  . Social connections:    Talks on phone: Not on file    Gets together: Not on file    Attends religious service: Not on file    Active member of club or organization: Not on file    Attends meetings of clubs or organizations: Not on file    Relationship status: Not on file  . Intimate partner violence:    Fear of current or ex partner: Not on file    Emotionally abused: Not on file    Physically abused: Not on file    Forced sexual activity: Not on file  Other Topics Concern  . Not on file  Social History Narrative   Married   Current Meds  Medication Sig  . simvastatin (ZOCOR) 20 MG tablet TAKE 1 TABLET (20 MG TOTAL) BY MOUTH EVERY EVENING.  . SYNTHROID 75 MCG tablet Take 1 tablet (75 mcg total) by mouth daily.  Marland Kitchen triamterene-hydrochlorothiazide (MAXZIDE-25) 37.5-25 MG tablet TAKE 1 TABLET BY MOUTH DAILY.   No Known Allergies No results found for this or any previous  visit (from the past 2160 hour(s)). Objective  Body mass index is 31.98 kg/m. Wt Readings from Last 3 Encounters:  04/14/18 147 lb 12.8 oz (67 kg)  11/21/17 145 lb 8 oz (66 kg)  11/15/16 148 lb 9.6 oz (67.4 kg)   Temp Readings from Last 3 Encounters:  04/14/18 97.6 F (36.4 C) (Oral)  11/21/17 98.3 F (36.8 C) (Oral)  11/15/16 97.8 F (36.6 C) (Oral)   BP Readings from Last 3 Encounters:  04/14/18 128/66  11/21/17 122/74  11/15/16 128/70   Pulse Readings from Last 3 Encounters:  04/14/18 71  11/21/17 85  11/15/16 77    Physical Exam Vitals signs and nursing note reviewed.  Constitutional:      Appearance: Normal appearance. She is well-developed and well-groomed.  HENT:     Head: Normocephalic and atraumatic.     Nose: Nose normal.     Mouth/Throat:     Mouth: Mucous membranes are moist.     Pharynx: Oropharynx is clear.  Eyes:     Conjunctiva/sclera: Conjunctivae normal.      Pupils: Pupils are equal, round, and reactive to light.  Cardiovascular:     Rate and Rhythm: Normal rate and regular rhythm.     Heart sounds: Normal heart sounds.  Pulmonary:     Effort: Pulmonary effort is normal.     Breath sounds: Normal breath sounds.  Chest:     Chest wall: No mass.     Breasts: Breasts are symmetrical.        Right: Normal. No swelling, bleeding, inverted nipple, mass, nipple discharge, skin change or tenderness.        Left: Normal.  Musculoskeletal:     Comments: No pain with ROM actually feels better    Lymphadenopathy:     Upper Body:     Right upper body: No axillary adenopathy.  Skin:    General: Skin is warm and dry.  Neurological:     General: No focal deficit present.     Mental Status: She is alert and oriented to person, place, and time. Mental status is at baseline.     Gait: Gait normal.  Psychiatric:        Attention and Perception: Attention and perception normal.        Mood and Affect: Mood and affect normal.        Speech: Speech normal.        Behavior: Behavior normal. Behavior is cooperative.        Thought Content: Thought content normal.        Cognition and Memory: Cognition and memory normal.        Judgment: Judgment normal.     Assessment   1. Right shoulder pain ddx arthritis/rotator cuff MSK from heavy lifting grand kid less likely breast pain not having any on tenderness on exam Plan   2. Topical otc creams rec  Sparingly nsaid, prn Tylenol  Xray right shoulder  Let us know if pain worse or not better  Try stretches for shoulder     Provider: Dr. Olivia Mackie McLean-Scocuzza-[facility]

## 2018-04-14 NOTE — Progress Notes (Signed)
Pre visit review using our clinic review tool, if applicable. No additional management support is needed unless otherwise documented below in the visit note. 

## 2018-05-20 DIAGNOSIS — L538 Other specified erythematous conditions: Secondary | ICD-10-CM | POA: Diagnosis not present

## 2018-05-20 DIAGNOSIS — L820 Inflamed seborrheic keratosis: Secondary | ICD-10-CM | POA: Diagnosis not present

## 2018-05-30 ENCOUNTER — Other Ambulatory Visit: Payer: Self-pay | Admitting: Internal Medicine

## 2018-07-26 DIAGNOSIS — L237 Allergic contact dermatitis due to plants, except food: Secondary | ICD-10-CM | POA: Diagnosis not present

## 2018-09-07 ENCOUNTER — Other Ambulatory Visit: Payer: Self-pay | Admitting: Internal Medicine

## 2018-09-22 DIAGNOSIS — Z20828 Contact with and (suspected) exposure to other viral communicable diseases: Secondary | ICD-10-CM | POA: Diagnosis not present

## 2018-09-22 DIAGNOSIS — R0982 Postnasal drip: Secondary | ICD-10-CM | POA: Diagnosis not present

## 2018-10-27 DIAGNOSIS — Z1231 Encounter for screening mammogram for malignant neoplasm of breast: Secondary | ICD-10-CM | POA: Diagnosis not present

## 2018-10-27 LAB — HM MAMMOGRAPHY

## 2018-11-19 ENCOUNTER — Other Ambulatory Visit: Payer: Self-pay

## 2018-11-23 ENCOUNTER — Other Ambulatory Visit: Payer: Self-pay

## 2018-11-23 ENCOUNTER — Ambulatory Visit (INDEPENDENT_AMBULATORY_CARE_PROVIDER_SITE_OTHER): Payer: BC Managed Care – PPO | Admitting: Internal Medicine

## 2018-11-23 ENCOUNTER — Encounter: Payer: Self-pay | Admitting: Internal Medicine

## 2018-11-23 VITALS — BP 112/76 | HR 78 | Temp 97.0°F | Resp 15 | Ht <= 58 in | Wt 147.4 lb

## 2018-11-23 DIAGNOSIS — E034 Atrophy of thyroid (acquired): Secondary | ICD-10-CM | POA: Diagnosis not present

## 2018-11-23 DIAGNOSIS — Z23 Encounter for immunization: Secondary | ICD-10-CM

## 2018-11-23 DIAGNOSIS — Z124 Encounter for screening for malignant neoplasm of cervix: Secondary | ICD-10-CM

## 2018-11-23 DIAGNOSIS — H9193 Unspecified hearing loss, bilateral: Secondary | ICD-10-CM

## 2018-11-23 DIAGNOSIS — H9190 Unspecified hearing loss, unspecified ear: Secondary | ICD-10-CM | POA: Insufficient documentation

## 2018-11-23 DIAGNOSIS — I10 Essential (primary) hypertension: Secondary | ICD-10-CM

## 2018-11-23 DIAGNOSIS — E7800 Pure hypercholesterolemia, unspecified: Secondary | ICD-10-CM | POA: Diagnosis not present

## 2018-11-23 DIAGNOSIS — Z0000 Encounter for general adult medical examination without abnormal findings: Secondary | ICD-10-CM

## 2018-11-23 DIAGNOSIS — H903 Sensorineural hearing loss, bilateral: Secondary | ICD-10-CM

## 2018-11-23 MED ORDER — ERGOCALCIFEROL 1.25 MG (50000 UT) PO CAPS: 50000.0000 [IU] | ORAL_CAPSULE | ORAL | 0 refills | Status: DC

## 2018-11-23 MED ORDER — ZOSTER VAC RECOMB ADJUVANTED 50 MCG/0.5ML IM SUSR: 0.5000 mL | Freq: Once | INTRAMUSCULAR | 1 refills | Status: AC

## 2018-11-23 NOTE — Assessment & Plan Note (Signed)
Well controlled on current regimen. Renal function and lytes assessment is due

## 2018-11-23 NOTE — Assessment & Plan Note (Signed)
Referral for formal testing made.

## 2018-11-23 NOTE — Progress Notes (Signed)
Patient ID: Kaitlyn Cortez, female    DOB: 1960-06-11  Age: 58 y.o. MRN: [id number]  The patient is here for annual  wellness examination and management of other chronic and acute problems.  mammogram normal sept 8 2020 [facility] Colonoscopy 2011 and normal    PAP normal 2018     The risk factors are reflected in the social history.  The roster of all physicians providing medical care to patient - is listed in the Snapshot section of the chart.  Activities of daily living:  The patient is 100% independent in all ADLs: dressing, toileting, feeding as well as independent mobility  Home safety : The patient has smoke detectors in the home. They wear seatbelts.  There are no firearms at home. There is no violence in the home.   There is no risks for hepatitis, STDs or HIV. There is no   history of blood transfusion. They have no travel history to infectious disease endemic areas of the world.  The patient has seen their dentist in the last six month. They have seen their eye doctor in the last year.   Patient feels she has had some nontraumatic hearing loss that started after an attack of vertigo  August 2015 while in [place].  No precedent ear pain . Vertigo was severe and she was treated in ED .They admit to slight hearing difficulty with regard to whispered voices and some television programs.  They have deferred audiologic testing in the last year.  They do not  have excessive sun exposure. Discussed the need for sun protection: hats, long sleeves and use of sunscreen if there is significant sun exposure.   Diet: the importance of a healthy diet is discussed. They do have a healthy diet.  The benefits of regular aerobic exercise were discussed. She walks 4 times per week ,  20 minutes.   Depression screen: there are no signs or vegative symptoms of depression- irritability, change in appetite, anhedonia, sadness/tearfullness.  Cognitive assessment: the patient manages all their financial  and personal affairs and is actively engaged. They could relate day,date,year and events; recalled 2/3 objects at 3 minutes; performed clock-face test normally.  The following portions of the patient's history were reviewed and updated as appropriate: allergies, current medications, past family history, past medical history,  past surgical history, past social history  and problem list.  Visual acuity was not assessed per patient preference since she has regular follow up with her ophthalmologist. Hearing and body mass index were assessed and reviewed.   During the course of the visit the patient was educated and counseled about appropriate screening and preventive services including : fall prevention , diabetes screening, nutrition counseling, colorectal cancer screening, and recommended immunizations.    CC: The primary encounter diagnosis was Bilateral hearing loss, unspecified hearing loss type. Diagnoses of Essential hypertension, Pure hypercholesterolemia, Hypothyroidism due to acquired atrophy of thyroid, Need for immunization against influenza, Visit for preventive health examination, Cervical cancer screening, and Sensorineural hearing loss (SNHL) of both ears were also pertinent to this visit.  Was covid tested bc of a cough,  Negative,  Resolved..  Test was very uncomfortable. (done correctly at [facility]) Has a gardnson coming [place] the work in November .  History Kaitlyn Cortez has a past medical history of Anemia, Diffuse cystic mastopathy (2011), Hyperlipidemia, Hypertension, hypothyroidism, Menopausal menorrhagia (2009), Overweight (BMI 25.0-29.9), and Unspecified disorder of thyroid (2011).   She has a past surgical history that includes Biopsy thyroid (2010); Tubal ligation; Breast  biopsy (2011); Hysteroscopy (2009); and Colonoscopy (2011).   Her family history includes CAD (age of onset: 7) in her mother; Heart disease in her maternal uncle; Heart disease (age of onset: 79) in her  father.She reports that she has never smoked. She has never used smokeless tobacco. She reports current alcohol use. She reports that she does not use drugs.  Outpatient Medications Prior to Visit  Medication Sig Dispense Refill  . simvastatin (ZOCOR) 20 MG tablet TAKE 1 TABLET (20 MG TOTAL) BY MOUTH EVERY EVENING. 30 tablet 9  . SYNTHROID 75 MCG tablet TAKE ONE TABLET BY MOUTH DAILY 90 tablet 0  . triamterene-hydrochlorothiazide (MAXZIDE-25) 37.5-25 MG tablet TAKE 1 TABLET BY MOUTH DAILY. 30 tablet 9  . cyclobenzaprine (FLEXERIL) 5 MG tablet Take 1 tablet (5 mg total) by mouth at bedtime as needed for muscle spasms. (Patient not taking: Reported on 11/23/2018) 30 tablet 0   No facility-administered medications prior to visit.     Review of Systems   Patient denies headache, fevers, malaise, unintentional weight loss, skin rash, eye pain, sinus congestion and sinus pain, sore throat, dysphagia,  hemoptysis , cough, dyspnea, wheezing, chest pain, palpitations, orthopnea, edema, abdominal pain, nausea, melena, diarrhea, constipation, flank pain, dysuria, hematuria, urinary  Frequency, nocturia, numbness, tingling, seizures,  Focal weakness, Loss of consciousness,  Tremor, insomnia, depression, anxiety, and suicidal ideation.      Objective:  BP 112/76 (BP Location: Left Arm, Patient Position: Sitting, Cuff Size: Normal)   Pulse 78   Temp (!) 97 F (36.1 C) (Temporal)   Resp 15   Ht 4\' 9"  (1.448 m)   Wt 147 lb 6.4 oz (66.9 kg)   SpO2 98%   BMI 31.90 kg/m   Physical Exam  General appearance: alert, cooperative and appears stated age Ears: normal TM's and external ear canals both ears Throat: lips, mucosa, and tongue normal; teeth and gums normal Neck: no adenopathy, no carotid bruit, supple, symmetrical, trachea midline and thyroid not enlarged, symmetric, no tenderness/mass/nodules Back: symmetric, no curvature. ROM normal. No CVA tenderness. Lungs: clear to auscultation  bilaterally Heart: regular rate and rhythm, S1, S2 normal, no murmur, click, rub or gallop Abdomen: soft, non-tender; bowel sounds normal; no masses,  no organomegaly Pulses: 2+ and symmetric Skin: Skin color, texture, turgor normal. No rashes or lesions Lymph nodes: Cervical, supraclavicular, and axillary nodes normal. Assessment & Plan:   Problem List Items Addressed This Visit      Unprioritized   Hypertension (Chronic)    Well controlled on current regimen. Renal function and lytes assessment is due       Relevant Orders   Comprehensive metabolic panel   Hypothyroidism    Thyroid function has been  WNL on current dose.   Repeat level needed .   Lab Results  Component Value Date   TSH 1.58 11/21/2017        Relevant Orders   TSH   Visit for preventive health examination    age appropriate education and counseling updated, referrals for preventative services and immunizations addressed, dietary and smoking counseling addressed, most recent labs reviewed.  I have personally reviewed and have noted:  1) the patient's medical and social history 2) The pt's use of alcohol, tobacco, and illicit drugs 3) The patient's current medications and supplements 4) Functional ability including ADL's, fall risk, home safety risk, hearing and visual impairment 5) Diet and physical activities 6) Evidence for depression or mood disorder 7) The patient's height, weight, and BMI have  been recorded in the chart  I have made referrals, and provided counseling and education based on review of the above      Cervical cancer screening    Last PAP normal 2018 by [facility]>  To be done by me next year.      Hearing loss - Primary    Referral for formal testing made.       Relevant Orders   Ambulatory referral to Audiology   Hyperlipidemia   Relevant Orders   Lipid panel    Other Visit Diagnoses    Need for immunization against influenza       Relevant Orders   Flu Vaccine QUAD  36+ mos IM (Completed)      I have discontinued Lezlie Octave. Bunton's cyclobenzaprine. I am also having her start on Zoster Vaccine Adjuvanted and ergocalciferol. Additionally, I am having her maintain her simvastatin, triamterene-hydrochlorothiazide, and Synthroid.  Meds ordered this encounter  Medications  . Zoster Vaccine Adjuvanted [facility]) injection    Sig: Inject 0.5 mLs into the muscle once for 1 dose.    Dispense:  1 each    Refill:  1  . ergocalciferol (DRISDOL) 1.25 MG (50000 UT) capsule    Sig: Take 1 capsule (50,000 Units total) by mouth once a week.    Dispense:  12 capsule    Refill:  0    Medications Discontinued During This Encounter  Medication Reason  . cyclobenzaprine (FLEXERIL) 5 MG tablet Patient has not taken in last 30 days    Follow-up: No follow-ups on file.   Crecencio Mc, MD

## 2018-11-23 NOTE — Assessment & Plan Note (Signed)
Last PAP normal 2018 by [facility]>  To be done by me next year.

## 2018-11-23 NOTE — Assessment & Plan Note (Signed)

## 2018-11-23 NOTE — Assessment & Plan Note (Signed)
Thyroid function has been  WNL on current dose.   Repeat level needed .   Lab Results  Component Value Date   TSH 1.58 11/21/2017

## 2018-11-23 NOTE — Patient Instructions (Signed)
Nonfasting labs today  Refilling all meds for 90 days pending  review of labs today  We are repeating the once weekly megadose of Vitmain D,  After 4 weeks,  Start taking 2000 Ius od OTC D3 daily   Hearing test ordered.    Colonoscopy and PAP smear are due next year.     Health Maintenance for Postmenopausal Women Menopause is a normal process in which your ability to get pregnant comes to an end. This process happens slowly over many months or years, usually between the ages of 4 and 75. Menopause is complete when you have missed your menstrual periods for 12 months. It is important to talk with your health care provider about some of the most common conditions that affect women after menopause (postmenopausal women). These include heart disease, cancer, and bone loss (osteoporosis). Adopting a healthy lifestyle and getting preventive care can help to promote your health and wellness. The actions you take can also lower your chances of developing some of these common conditions. What should I know about menopause? During menopause, you may get a number of symptoms, such as:  Hot flashes. These can be moderate or severe.  Night sweats.  Decrease in sex drive.  Mood swings.  Headaches.  Tiredness.  Irritability.  Memory problems.  Insomnia. Choosing to treat or not to treat these symptoms is a decision that you make with your health care provider. Do I need hormone replacement therapy?  Hormone replacement therapy is effective in treating symptoms that are caused by menopause, such as hot flashes and night sweats.  Hormone replacement carries certain risks, especially as you become older. If you are thinking about using estrogen or estrogen with progestin, discuss the benefits and risks with your health care provider. What is my risk for heart disease and stroke? The risk of heart disease, heart attack, and stroke increases as you age. One of the causes may be a change in  the body's hormones during menopause. This can affect how your body uses dietary fats, triglycerides, and cholesterol. Heart attack and stroke are medical emergencies. There are many things that you can do to help prevent heart disease and stroke. Watch your blood pressure  High blood pressure causes heart disease and increases the risk of stroke. This is more likely to develop in people who have high blood pressure readings, are of African descent, or are overweight.  Have your blood pressure checked: ? Every 3-5 years if you are 12-56 years of age. ? Every year if you are 36 years old or older. Eat a healthy diet   Eat a diet that includes plenty of vegetables, fruits, low-fat dairy products, and lean protein.  Do not eat a lot of foods that are high in solid fats, added sugars, or sodium. Get regular exercise Get regular exercise. This is one of the most important things you can do for your health. Most adults should:  Try to exercise for at least 150 minutes each week. The exercise should increase your heart rate and make you sweat (moderate-intensity exercise).  Try to do strengthening exercises at least twice each week. Do these in addition to the moderate-intensity exercise.  Spend less time sitting. Even light physical activity can be beneficial. Other tips  Work with your health care provider to achieve or maintain a healthy weight.  Do not use any products that contain nicotine or tobacco, such as cigarettes, e-cigarettes, and chewing tobacco. If you need help quitting, ask your health care  provider.  Know your numbers. Ask your health care provider to check your cholesterol and your blood sugar (glucose). Continue to have your blood tested as directed by your health care provider. Do I need screening for cancer? Depending on your health history and family history, you may need to have cancer screening at different stages of your life. This may include screening for:  Breast  cancer.  Cervical cancer.  Lung cancer.  Colorectal cancer. What is my risk for osteoporosis? After menopause, you may be at increased risk for osteoporosis. Osteoporosis is a condition in which bone destruction happens more quickly than new bone creation. To help prevent osteoporosis or the bone fractures that can happen because of osteoporosis, you may take the following actions:  If you are 38-79 years old, get at least 1,000 mg of calcium and at least 600 mg of vitamin D per day.  If you are older than age 82 but younger than age 37, get at least 1,200 mg of calcium and at least 600 mg of vitamin D per day.  If you are older than age 70, get at least 1,200 mg of calcium and at least 800 mg of vitamin D per day. Smoking and drinking excessive alcohol increase the risk of osteoporosis. Eat foods that are rich in calcium and vitamin D, and do weight-bearing exercises several times each week as directed by your health care provider. How does menopause affect my mental health? Depression may occur at any age, but it is more common as you become older. Common symptoms of depression include:  Low or sad mood.  Changes in sleep patterns.  Changes in appetite or eating patterns.  Feeling an overall lack of motivation or enjoyment of activities that you previously enjoyed.  Frequent crying spells. Talk with your health care provider if you think that you are experiencing depression. General instructions See your health care provider for regular wellness exams and vaccines. This may include:  Scheduling regular health, dental, and eye exams.  Getting and maintaining your vaccines. These include: ? Influenza vaccine. Get this vaccine each year before the flu season begins. ? Pneumonia vaccine. ? Shingles vaccine. ? Tetanus, diphtheria, and pertussis (Tdap) booster vaccine. Your health care provider may also recommend other immunizations. Tell your health care provider if you have ever  been abused or do not feel safe at home. Summary  Menopause is a normal process in which your ability to get pregnant comes to an end.  This condition causes hot flashes, night sweats, decreased interest in sex, mood swings, headaches, or lack of sleep.  Treatment for this condition may include hormone replacement therapy.  Take actions to keep yourself healthy, including exercising regularly, eating a healthy diet, watching your weight, and checking your blood pressure and blood sugar levels.  Get screened for cancer and depression. Make sure that you are up to date with all your vaccines. This information is not intended to replace advice given to you by your health care provider. Make sure you discuss any questions you have with your health care provider. Document Released: 03/29/2005 Document Revised: 01/28/2018 Document Reviewed: 01/28/2018 Elsevier Patient Education  2020 [employer].

## 2018-11-24 LAB — COMPREHENSIVE METABOLIC PANEL
ALT: 14 U/L (ref 0–35)
AST: 15 U/L (ref 0–37)
Albumin: 4.2 g/dL (ref 3.5–5.2)
Alkaline Phosphatase: 63 U/L (ref 39–117)
BUN: 16 mg/dL (ref 6–23)
CO2: 29 mEq/L (ref 19–32)
Calcium: 9.1 mg/dL (ref 8.4–10.5)
Chloride: 104 mEq/L (ref 96–112)
Creatinine, Ser: 0.78 mg/dL (ref 0.40–1.20)
GFR: 75.17 mL/min (ref >60.00)
Glucose, Bld: 74 mg/dL (ref 70–99)
Potassium: 3.6 mEq/L (ref 3.5–5.1)
Sodium: 140 mEq/L (ref 135–145)
Total Bilirubin: 0.7 mg/dL (ref 0.2–1.2)
Total Protein: 6.6 g/dL (ref 6.0–8.3)

## 2018-11-24 LAB — LIPID PANEL
Cholesterol: 145 mg/dL (ref 0–200)
HDL: 31.9 mg/dL — ABNORMAL LOW (ref >39.00)
NonHDL: 113.53
Total CHOL/HDL Ratio: 5
Triglycerides: 216 mg/dL — ABNORMAL HIGH (ref 0.0–149.0)
VLDL: 43.2 mg/dL — ABNORMAL HIGH (ref 0.0–40.0)

## 2018-11-24 LAB — TSH: TSH: 1.38 u[IU]/mL (ref 0.35–4.50)

## 2018-11-24 LAB — LDL CHOLESTEROL, DIRECT: Direct LDL: 84 mg/dL

## 2018-12-14 DIAGNOSIS — H903 Sensorineural hearing loss, bilateral: Secondary | ICD-10-CM | POA: Diagnosis not present

## 2018-12-31 ENCOUNTER — Other Ambulatory Visit: Payer: Self-pay | Admitting: Internal Medicine

## 2018-12-31 MED ORDER — SYNTHROID 75 MCG PO TABS: 75.0000 ug | ORAL_TABLET | Freq: Every day | ORAL | 1 refills | Status: DC

## 2018-12-31 MED ORDER — TRIAMTERENE-HCTZ 37.5-25 MG PO TABS: 1.0000 | ORAL_TABLET | Freq: Every day | ORAL | 1 refills | Status: DC

## 2018-12-31 MED ORDER — SIMVASTATIN 20 MG PO TABS: ORAL_TABLET | ORAL | 1 refills | Status: DC

## 2018-12-31 NOTE — Addendum Note (Signed)
Addended by: Nanci Pina on: 12/31/2018 12:04 PM   Modules accepted: Orders

## 2019-03-29 DIAGNOSIS — D225 Melanocytic nevi of trunk: Secondary | ICD-10-CM | POA: Diagnosis not present

## 2019-03-29 DIAGNOSIS — D2272 Melanocytic nevi of left lower limb, including hip: Secondary | ICD-10-CM | POA: Diagnosis not present

## 2019-03-29 DIAGNOSIS — D2262 Melanocytic nevi of left upper limb, including shoulder: Secondary | ICD-10-CM | POA: Diagnosis not present

## 2019-03-29 DIAGNOSIS — D2261 Melanocytic nevi of right upper limb, including shoulder: Secondary | ICD-10-CM | POA: Diagnosis not present

## 2019-07-07 IMAGING — DX DG SHOULDER 2+V*R*
2 series · 2 of 2 positions shown · non-contrast
Comparison: None.

CLINICAL DATA: Acute right shoulder pain without known injury.

EXAM:
RIGHT SHOULDER - 2+ VIEW

[shoulder (grashey) ap]
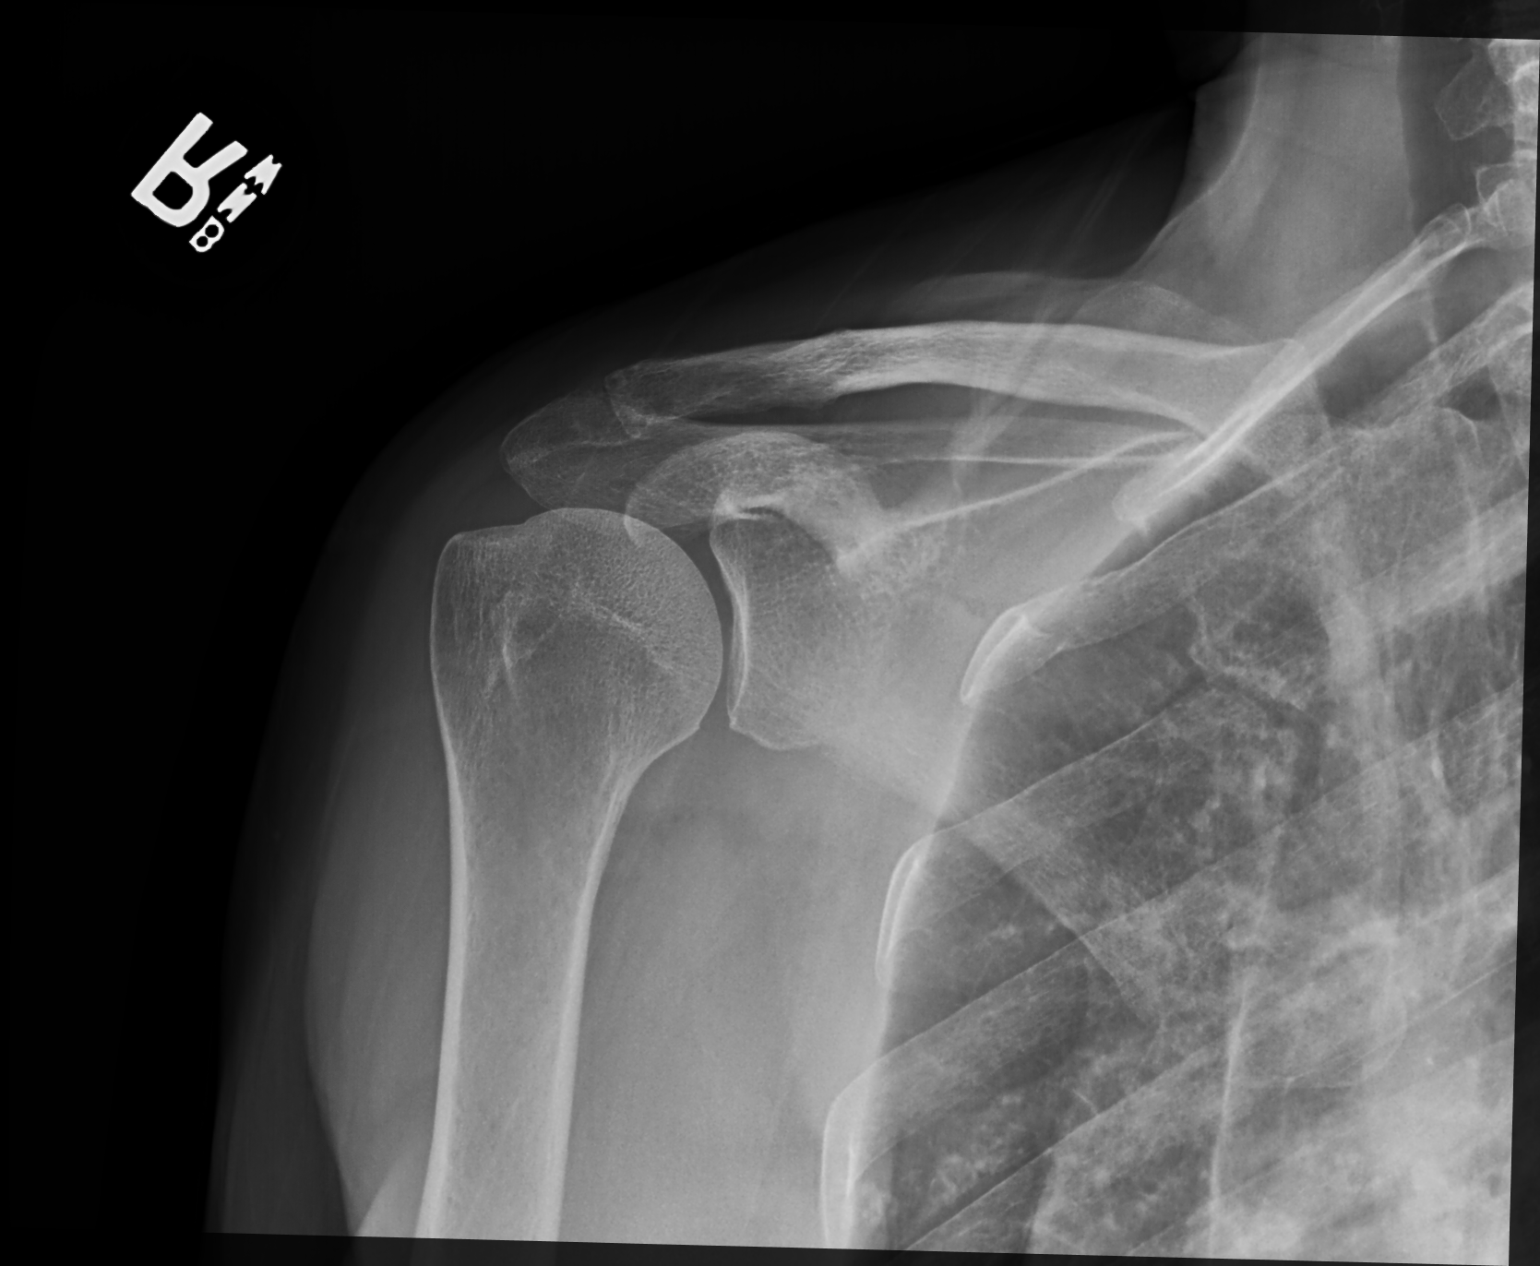

[shoulder y view]
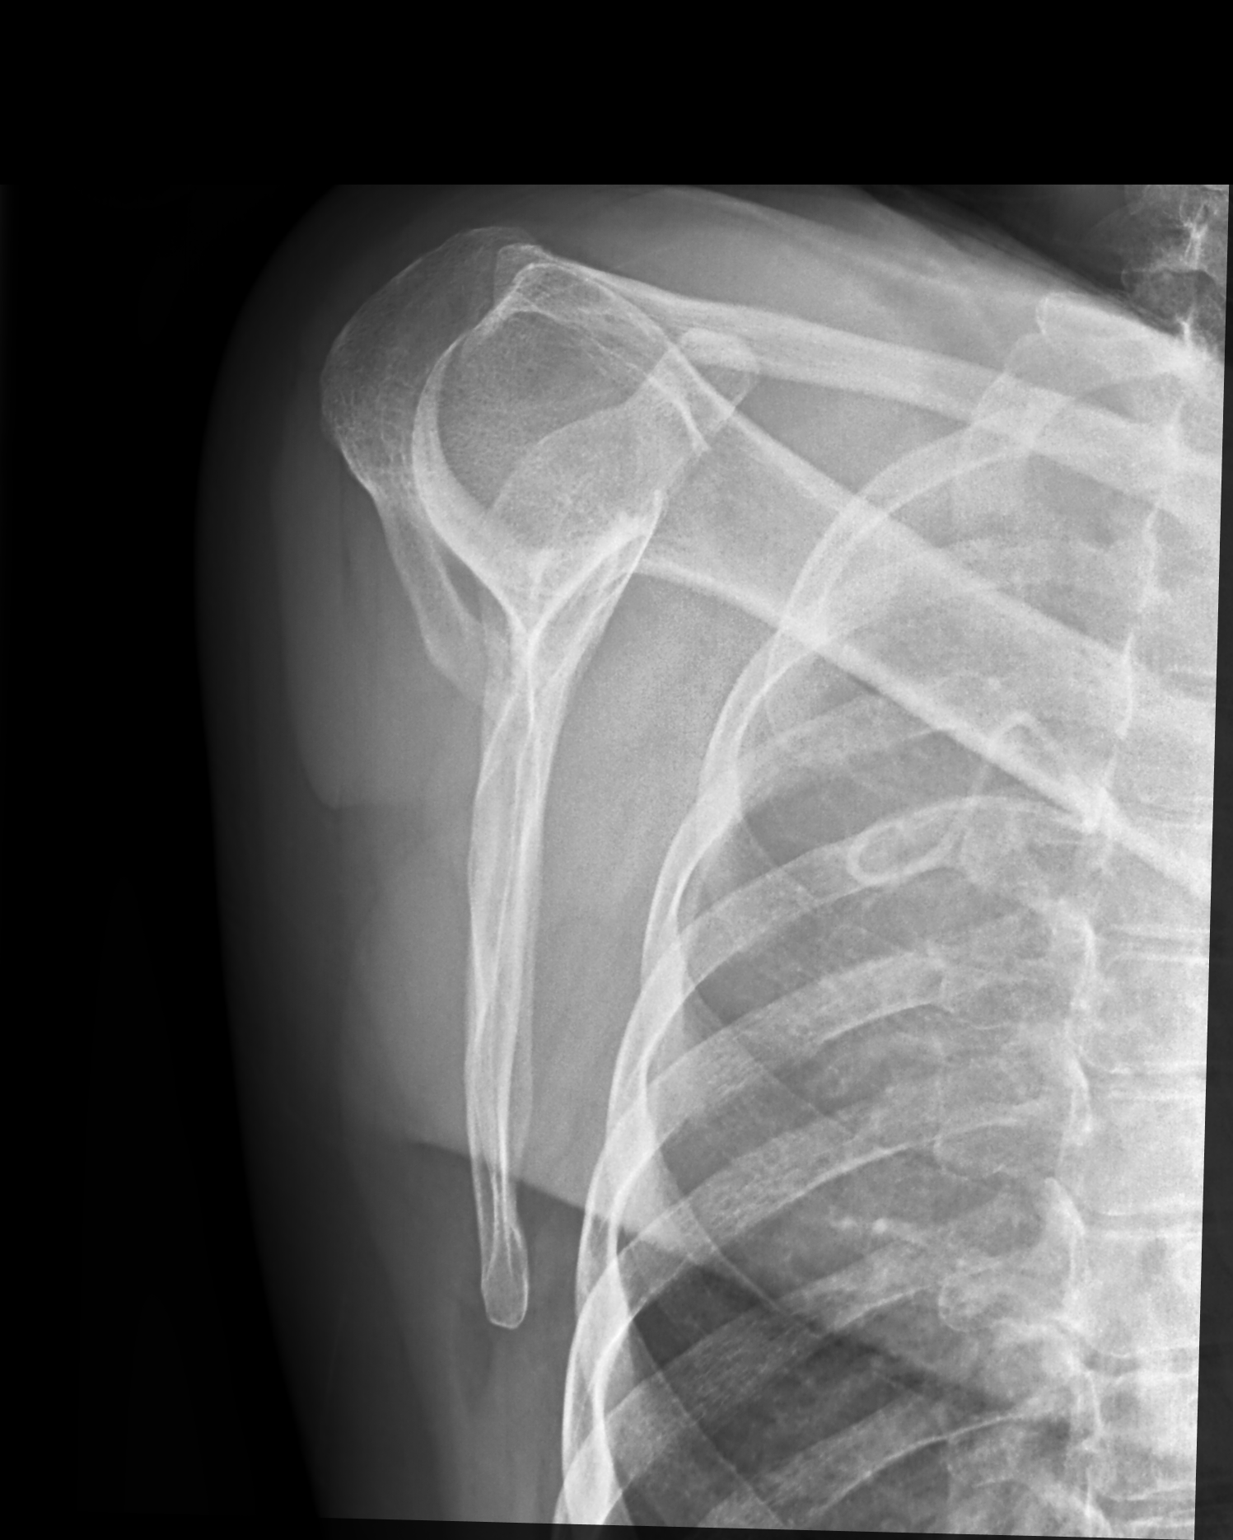

[2 of 2 positions shown; findings below may reference images not displayed]

FINDINGS: There is no evidence of fracture or dislocation. There is no
evidence of arthropathy or other focal bone abnormality. Soft
tissues are unremarkable.
IMPRESSION: Negative.

## 2019-09-25 ENCOUNTER — Other Ambulatory Visit: Payer: Self-pay | Admitting: Internal Medicine

## 2019-10-28 ENCOUNTER — Telehealth: Payer: Self-pay | Admitting: Internal Medicine

## 2019-10-28 NOTE — Telephone Encounter (Signed)
Patient called wanting to know if someone could recommend  good weight loss program .

## 2019-10-29 NOTE — Telephone Encounter (Signed)
Yes the most effective one I have seen is the [place].  She can google it or go online

## 2019-10-29 NOTE — Telephone Encounter (Signed)
II recommend she make  an appt to discuss since I have not seen her in nearly a year , and this should be separate, NOT be part of her CPE

## 2019-10-29 NOTE — Telephone Encounter (Signed)
Called pt to let her know that  you recommend the [place]. Pt stated that diet is very expensive "like $400 a month". Pt is wanting to know if there is a nutritionist that she could see that might be a little less expensive.

## 2019-11-01 DIAGNOSIS — Z1231 Encounter for screening mammogram for malignant neoplasm of breast: Secondary | ICD-10-CM | POA: Diagnosis not present

## 2019-11-01 NOTE — Telephone Encounter (Signed)
Spoke with pt to let her know that Dr. Derrel Nip would like for her to schedule an appt to discuss that is not her CPE. Pt stated that she does not have the time to schedule another appt at this time.

## 2019-11-15 DIAGNOSIS — R928 Other abnormal and inconclusive findings on diagnostic imaging of breast: Secondary | ICD-10-CM | POA: Diagnosis not present

## 2019-11-24 ENCOUNTER — Other Ambulatory Visit: Payer: Self-pay | Admitting: Internal Medicine

## 2019-11-24 ENCOUNTER — Encounter: Payer: Self-pay | Admitting: Internal Medicine

## 2019-11-24 ENCOUNTER — Other Ambulatory Visit: Payer: Self-pay

## 2019-11-24 ENCOUNTER — Other Ambulatory Visit (HOSPITAL_COMMUNITY)
Admission: RE | Admit: 2019-11-24 | Discharge: 2019-11-24 | Disposition: A | Discharge status: Home / Self Care | Payer: BC Managed Care – PPO | Source: Ambulatory Visit | Attending: Internal Medicine | Admitting: Internal Medicine

## 2019-11-24 ENCOUNTER — Ambulatory Visit (INDEPENDENT_AMBULATORY_CARE_PROVIDER_SITE_OTHER): Payer: BC Managed Care – PPO | Admitting: Internal Medicine

## 2019-11-24 VITALS — BP 120/74 | HR 70 | Temp 97.7°F | Resp 14 | Ht 60.25 in | Wt 142.2 lb

## 2019-11-24 DIAGNOSIS — I10 Essential (primary) hypertension: Secondary | ICD-10-CM | POA: Diagnosis not present

## 2019-11-24 DIAGNOSIS — Z23 Encounter for immunization: Secondary | ICD-10-CM | POA: Diagnosis not present

## 2019-11-24 DIAGNOSIS — E034 Atrophy of thyroid (acquired): Secondary | ICD-10-CM

## 2019-11-24 DIAGNOSIS — E663 Overweight: Secondary | ICD-10-CM

## 2019-11-24 DIAGNOSIS — E559 Vitamin D deficiency, unspecified: Secondary | ICD-10-CM | POA: Diagnosis not present

## 2019-11-24 DIAGNOSIS — Z0000 Encounter for general adult medical examination without abnormal findings: Secondary | ICD-10-CM | POA: Diagnosis not present

## 2019-11-24 DIAGNOSIS — E7800 Pure hypercholesterolemia, unspecified: Secondary | ICD-10-CM

## 2019-11-24 DIAGNOSIS — Z124 Encounter for screening for malignant neoplasm of cervix: Secondary | ICD-10-CM

## 2019-11-24 LAB — LIPID PANEL
Cholesterol: 149 mg/dL (ref 0–200)
HDL: 36.9 mg/dL — ABNORMAL LOW (ref >39.00)
LDL Cholesterol: 76 mg/dL (ref 0–99)
NonHDL: 111.64
Total CHOL/HDL Ratio: 4
Triglycerides: 179 mg/dL — ABNORMAL HIGH (ref 0.0–149.0)
VLDL: 35.8 mg/dL (ref 0.0–40.0)

## 2019-11-24 LAB — TSH: TSH: 2.56 u[IU]/mL (ref 0.35–4.50)

## 2019-11-24 LAB — COMPREHENSIVE METABOLIC PANEL
ALT: 13 U/L (ref 0–35)
AST: 15 U/L (ref 0–37)
Albumin: 4.6 g/dL (ref 3.5–5.2)
Alkaline Phosphatase: 66 U/L (ref 39–117)
BUN: 20 mg/dL (ref 6–23)
CO2: 29 mEq/L (ref 19–32)
Calcium: 9.8 mg/dL (ref 8.4–10.5)
Chloride: 101 mEq/L (ref 96–112)
Creatinine, Ser: 0.84 mg/dL (ref 0.40–1.20)
GFR: 74.67 mL/min (ref >60.00)
Glucose, Bld: 86 mg/dL (ref 70–99)
Potassium: 3.6 mEq/L (ref 3.5–5.1)
Sodium: 138 mEq/L (ref 135–145)
Total Bilirubin: 0.8 mg/dL (ref 0.2–1.2)
Total Protein: 7.1 g/dL (ref 6.0–8.3)

## 2019-11-24 LAB — VITAMIN D 25 HYDROXY (VIT D DEFICIENCY, FRACTURES): VITD: 28.88 ng/mL — ABNORMAL LOW (ref 30.00–100.00)

## 2019-11-24 NOTE — Patient Instructions (Signed)
For the weight loss interest:  1) agree that portion reduction  Is key. Limit calories to 1200 daily 2) 30 minutes of AEROBIC exercise is key.  Walking counts if it makes you too short of breath to sing.    For the dryness;  Vaginal estrogen is a prescribable option    Health Maintenance for Postmenopausal Women Menopause is a normal process in which your ability to get pregnant comes to an end. This process happens slowly over many months or years, usually between the ages of 32 and 66. Menopause is complete when you have missed your menstrual periods for 12 months. It is important to talk with your health care provider about some of the most common conditions that affect women after menopause (postmenopausal women). These include heart disease, cancer, and bone loss (osteoporosis). Adopting a healthy lifestyle and getting preventive care can help to promote your health and wellness. The actions you take can also lower your chances of developing some of these common conditions. What should I know about menopause? During menopause, you may get a number of symptoms, such as:  Hot flashes. These can be moderate or severe.  Night sweats.  Decrease in sex drive.  Mood swings.  Headaches.  Tiredness.  Irritability.  Memory problems.  Insomnia. Choosing to treat or not to treat these symptoms is a decision that you make with your health care provider. Do I need hormone replacement therapy?  Hormone replacement therapy is effective in treating symptoms that are caused by menopause, such as hot flashes and night sweats.  Hormone replacement carries certain risks, especially as you become older. If you are thinking about using estrogen or estrogen with progestin, discuss the benefits and risks with your health care provider. What is my risk for heart disease and stroke? The risk of heart disease, heart attack, and stroke increases as you age. One of the causes may be a change in the  body's hormones during menopause. This can affect how your body uses dietary fats, triglycerides, and cholesterol. Heart attack and stroke are medical emergencies. There are many things that you can do to help prevent heart disease and stroke. Watch your blood pressure  High blood pressure causes heart disease and increases the risk of stroke. This is more likely to develop in people who have high blood pressure readings, are of African descent, or are overweight.  Have your blood pressure checked: ? Every 3-5 years if you are 29-5 years of age. ? Every year if you are 79 years old or older. Eat a healthy diet   Eat a diet that includes plenty of vegetables, fruits, low-fat dairy products, and lean protein.  Do not eat a lot of foods that are high in solid fats, added sugars, or sodium. Get regular exercise Get regular exercise. This is one of the most important things you can do for your health. Most adults should:  Try to exercise for at least 150 minutes each week. The exercise should increase your heart rate and make you sweat (moderate-intensity exercise).  Try to do strengthening exercises at least twice each week. Do these in addition to the moderate-intensity exercise.  Spend less time sitting. Even light physical activity can be beneficial. Other tips  Work with your health care provider to achieve or maintain a healthy weight.  Do not use any products that contain nicotine or tobacco, such as cigarettes, e-cigarettes, and chewing tobacco. If you need help quitting, ask your health care provider.  Know your  numbers. Ask your health care provider to check your cholesterol and your blood sugar (glucose). Continue to have your blood tested as directed by your health care provider. Do I need screening for cancer? Depending on your health history and family history, you may need to have cancer screening at different stages of your life. This may include screening for:  Breast  cancer.  Cervical cancer.  Lung cancer.  Colorectal cancer. What is my risk for osteoporosis? After menopause, you may be at increased risk for osteoporosis. Osteoporosis is a condition in which bone destruction happens more quickly than new bone creation. To help prevent osteoporosis or the bone fractures that can happen because of osteoporosis, you may take the following actions:  If you are 72-42 years old, get at least 1,000 mg of calcium and at least 600 mg of vitamin D per day.  If you are older than age 33 but younger than age 58, get at least 1,200 mg of calcium and at least 600 mg of vitamin D per day.  If you are older than age 21, get at least 1,200 mg of calcium and at least 800 mg of vitamin D per day. Smoking and drinking excessive alcohol increase the risk of osteoporosis. Eat foods that are rich in calcium and vitamin D, and do weight-bearing exercises several times each week as directed by your health care provider. How does menopause affect my mental health? Depression may occur at any age, but it is more common as you become older. Common symptoms of depression include:  Low or sad mood.  Changes in sleep patterns.  Changes in appetite or eating patterns.  Feeling an overall lack of motivation or enjoyment of activities that you previously enjoyed.  Frequent crying spells. Talk with your health care provider if you think that you are experiencing depression. General instructions See your health care provider for regular wellness exams and vaccines. This may include:  Scheduling regular health, dental, and eye exams.  Getting and maintaining your vaccines. These include: ? Influenza vaccine. Get this vaccine each year before the flu season begins. ? Pneumonia vaccine. ? Shingles vaccine. ? Tetanus, diphtheria, and pertussis (Tdap) booster vaccine. Your health care provider may also recommend other immunizations. Tell your health care provider if you have ever  been abused or do not feel safe at home. Summary  Menopause is a normal process in which your ability to get pregnant comes to an end.  This condition causes hot flashes, night sweats, decreased interest in sex, mood swings, headaches, or lack of sleep.  Treatment for this condition may include hormone replacement therapy.  Take actions to keep yourself healthy, including exercising regularly, eating a healthy diet, watching your weight, and checking your blood pressure and blood sugar levels.  Get screened for cancer and depression. Make sure that you are up to date with all your vaccines. This information is not intended to replace advice given to you by your health care provider. Make sure you discuss any questions you have with your health care provider. Document Revised: 01/28/2018 Document Reviewed: 01/28/2018 Elsevier Patient Education  2020 [employer].

## 2019-11-24 NOTE — Progress Notes (Signed)
Patient ID: Kaitlyn Cortez, female    DOB: 1960-11-25  Age: 59 y.o. MRN: [id number]  The patient is here for annual preventive  examination and management of other chronic and acute problems.  This visit occurred during the SARS-CoV-2 public health emergency.  Safety protocols were in place, including screening questions prior to the visit, additional usage of staff PPE, and extensive cleaning of exam room while observing appropriate contact time as indicated for disinfecting solutions.    Patient has received both doses of the available COVID 19 vaccine without complications.  Patient continues to mask when outside of the home except when walking in yard or at safe distances from others .  Patient denies any change in mood or development of unhealthy behaviors resuting from the pandemic's restriction of activities and socialization.      The risk factors are reflected in the social history.  The roster of all physicians providing medical care to patient - is listed in the Snapshot section of the chart.  Activities of daily living:  The patient is 100% independent in all ADLs: dressing, toileting, feeding as well as independent mobility  Home safety : The patient has smoke detectors in the home. They wear seatbelts.  There are no firearms at home. There is no violence in the home.   There is no risks for hepatitis, STDs or HIV. There is no   history of blood transfusion. They have no travel history to infectious disease endemic areas of the world.  The patient has seen their dentist in the last six month. They have seen their eye doctor in the last year. They admit to slight hearing difficulty with regard to whispered voices and some television programs.  They have deferred audiologic testing in the last year.  They do not  have excessive sun exposure. Discussed the need for sun protection: hats, long sleeves and use of sunscreen if there is significant sun exposure.   Diet: the importance of a  healthy diet is discussed. They do have a healthy diet.  The benefits of regular aerobic exercise were discussed. She walks 4 times per week ,  20 minutes.   Depression screen: there are no signs or vegative symptoms of depression- irritability, change in appetite, anhedonia, sadness/tearfullness.  Cognitive assessment: the patient manages all their financial and personal affairs and is actively engaged. They could relate day,date,year and events; recalled 2/3 objects at 3 minutes; performed clock-face test normally.  The following portions of the patient's history were reviewed and updated as appropriate: allergies, current medications, past family history, past medical history,  past surgical history, past social history  and problem list.  Visual acuity was not assessed per patient preference since she has regular follow up with her ophthalmologist. Hearing and body mass index were assessed and reviewed.   During the course of the visit the patient was educated and counseled about appropriate screening and preventive services including : fall prevention , diabetes screening, nutrition counseling, colorectal cancer screening, and recommended immunizations.    CC: The primary encounter diagnosis was Primary hypertension. Diagnoses of Pure hypercholesterolemia, Hypothyroidism due to acquired atrophy of thyroid, Vitamin D deficiency, Papanicolaou smear for cervical cancer screening, Need for immunization against influenza, Overweight (BMI 25.0-29.9), and Visit for preventive health examination were also pertinent to this visit.  1) overweight:  Has already lost 5 lbs on her own. Discussed diet and exercise needs.    History Kaitlyn Cortez has a past medical history of Anemia, Diffuse cystic mastopathy (2011),  Hyperlipidemia, Hypertension, hypothyroidism, Menopausal menorrhagia (2009), Overweight (BMI 25.0-29.9), and Unspecified disorder of thyroid (2011).   She has a past surgical history that includes  Biopsy thyroid (2010); Tubal ligation; Breast biopsy (2011); Hysteroscopy (2009); and Colonoscopy (2011).   Her family history includes CAD (age of onset: 76) in her mother; Heart disease in her maternal uncle; Heart disease (age of onset: 61) in her father.She reports that she has never smoked. She has never used smokeless tobacco. She reports current alcohol use. She reports that she does not use drugs.  Outpatient Medications Prior to Visit  Medication Sig Dispense Refill  . simvastatin (ZOCOR) 20 MG tablet TAKE 1 TABLET (20 MG TOTAL) BY MOUTH EVERY EVENING. 90 tablet 1  . triamterene-hydrochlorothiazide (MAXZIDE-25) 37.5-25 MG tablet Take 1 tablet by mouth daily. 90 tablet 1  . SYNTHROID 75 MCG tablet TAKE ONE TABLET BY MOUTH DAILY 30 tablet 1  . ergocalciferol (DRISDOL) 1.25 MG (50000 UT) capsule Take 1 capsule (50,000 Units total) by mouth once a week. (Patient not taking: Reported on 11/24/2019) 12 capsule 0   No facility-administered medications prior to visit.    Review of Systems   Patient denies headache, fevers, malaise, unintentional weight loss, skin rash, eye pain, sinus congestion and sinus pain, sore throat, dysphagia,  hemoptysis , cough, dyspnea, wheezing, chest pain, palpitations, orthopnea, edema, abdominal pain, nausea, melena, diarrhea, constipation, flank pain, dysuria, hematuria, urinary  Frequency, nocturia, numbness, tingling, seizures,  Focal weakness, Loss of consciousness,  Tremor, insomnia, depression, anxiety, and suicidal ideation.      Objective:  BP 120/74 (BP Location: Left Arm, Patient Position: Sitting, Cuff Size: Normal)   Pulse 70   Temp 97.7 F (36.5 C) (Oral)   Resp 14   Ht 5' 0.25" (1.53 m)   Wt 142 lb 3.2 oz (64.5 kg)   SpO2 98%   BMI 27.54 kg/m   Physical Exam  General Appearance:    Alert, cooperative, no distress, appears stated age  Head:    Normocephalic, without obvious abnormality, atraumatic  Eyes:    PERRL, conjunctiva/corneas  clear, EOM's intact, fundi    benign, both eyes  Ears:    Normal TM's and external ear canals, both ears  Nose:   Nares normal, septum midline, mucosa normal, no drainage    or sinus tenderness  Throat:   Lips, mucosa, and tongue normal; teeth and gums normal  Neck:   Supple, symmetrical, trachea midline, no adenopathy;    thyroid:  no enlargement/tenderness/nodules; no carotid   bruit or JVD  Back:     Symmetric, no curvature, ROM normal, no CVA tenderness  Lungs:     Clear to auscultation bilaterally, respirations unlabored  Chest Wall:    No tenderness or deformity   Heart:    Regular rate and rhythm, S1 and S2 normal, no murmur, rub   or gallop  Breast Exam:    No tenderness, masses, or nipple abnormality  Abdomen:     Soft, non-tender, bowel sounds active all four quadrants,    no masses, no organomegaly  Genitalia:    Pelvic: cervix normal in appearance, external genitalia normal, no adnexal masses or tenderness, no cervical motion tenderness, rectovaginal septum normal, uterus normal size, shape, and consistency and vagina normal without discharge  Extremities:   Extremities normal, atraumatic, no cyanosis or edema  Pulses:   2+ and symmetric all extremities  Skin:   Skin color, texture, turgor normal, no rashes or lesions  Lymph nodes:   Cervical,  supraclavicular, and axillary nodes normal  Neurologic:   CNII-XII intact, normal strength, sensation and reflexes    throughout     Assessment & Plan:   Problem List Items Addressed This Visit      Unprioritized   Hyperlipidemia    LDL and triglycerides are at goal on 20 mg  simvastatin dose . she has no side effects and liver enzymes are normal. No changes today   Lab Results  Component Value Date   CHOL 149 11/24/2019   HDL 36.90 (L) 11/24/2019   LDLCALC 76 11/24/2019   LDLDIRECT 84.0 11/23/2018   TRIG 179.0 (H) 11/24/2019   CHOLHDL 4 11/24/2019   Lab Results  Component Value Date   ALT 13 11/24/2019   AST 15  11/24/2019   ALKPHOS 66 11/24/2019   BILITOT 0.8 11/24/2019         Relevant Orders   Lipid panel (Completed)   Hypertension - Primary (Chronic)    Well controlled on current regimen of maxzide 37.5/25 mg daily .   Renal function stable, no changes today.  Lab Results  Component Value Date   CREATININE 0.84 11/24/2019   Lab Results  Component Value Date   NA 138 11/24/2019   K 3.6 11/24/2019   CL 101 11/24/2019   CO2 29 11/24/2019         Relevant Orders   Comprehensive metabolic panel (Completed)   Hypothyroidism    Thyroid function is WNL on current dose of 75 mcg daily .  No current changes needed.   Lab Results  Component Value Date   TSH 2.56 11/24/2019         Relevant Orders   TSH (Completed)   Overweight (BMI 25.0-29.9)    Body mass index is 27.54 kg/m.  Diet and exercise discussed at length today. I have recommended a low glycemic index diet utilizing smaller more frequent meals to increase metabolism.  I have also recommended that patient start exercising with a goal of 30 minutes of aerobic exercise a minimum of 5 days per week. Screening for lipid disorders, thyroid and diabetes to be done today.         Visit for preventive health examination    age appropriate education and counseling updated, referrals for preventative services and immunizations addressed, dietary and smoking counseling addressed, most recent labs reviewed.  I have personally reviewed and have noted:  1) the patient's medical and social history 2) The pt's use of alcohol, tobacco, and illicit drugs 3) The patient's current medications and supplements 4) Functional ability including ADL's, fall risk, home safety risk, hearing and visual impairment 5) Diet and physical activities 6) Evidence for depression or mood disorder 7) The patient's height, weight, and BMI have been recorded in the chart  I have made referrals, and provided counseling and education based on review of  the above      Vitamin D deficiency   Relevant Orders   VITAMIN D 25 Hydroxy (Vit-D Deficiency, Fractures) (Completed)    Other Visit Diagnoses    Papanicolaou smear for cervical cancer screening       Relevant Orders   Cytology - PAP( [place])   Need for immunization against influenza       Relevant Orders   Flu Vaccine QUAD 36+ mos IM (Completed)      I have discontinued Kaitlyn Cortez's ergocalciferol. I am also having her maintain her simvastatin and triamterene-hydrochlorothiazide.  No orders of the defined types were placed in  this encounter.   Medications Discontinued During This Encounter  Medication Reason  . ergocalciferol (DRISDOL) 1.25 MG (50000 UT) capsule Completed Course    Follow-up: No follow-ups on file.   Crecencio Mc, MD

## 2019-11-25 NOTE — Assessment & Plan Note (Signed)
LDL and triglycerides are at goal on 20 mg  simvastatin dose . she has no side effects and liver enzymes are normal. No changes today   Lab Results  Component Value Date   CHOL 149 11/24/2019   HDL 36.90 (L) 11/24/2019   LDLCALC 76 11/24/2019   LDLDIRECT 84.0 11/23/2018   TRIG 179.0 (H) 11/24/2019   CHOLHDL 4 11/24/2019   Lab Results  Component Value Date   ALT 13 11/24/2019   AST 15 11/24/2019   ALKPHOS 66 11/24/2019   BILITOT 0.8 11/24/2019

## 2019-11-25 NOTE — Assessment & Plan Note (Signed)
Body mass index is 27.54 kg/m.  Diet and exercise discussed at length today. I have recommended a low glycemic index diet utilizing smaller more frequent meals to increase metabolism.  I have also recommended that patient start exercising with a goal of 30 minutes of aerobic exercise a minimum of 5 days per week. Screening for lipid disorders, thyroid and diabetes to be done today.

## 2019-11-25 NOTE — Assessment & Plan Note (Signed)
Well controlled on current regimen of maxzide 37.5/25 mg daily .   Renal function stable, no changes today.  Lab Results  Component Value Date   CREATININE 0.84 11/24/2019   Lab Results  Component Value Date   NA 138 11/24/2019   K 3.6 11/24/2019   CL 101 11/24/2019   CO2 29 11/24/2019

## 2019-11-25 NOTE — Assessment & Plan Note (Signed)
Thyroid function is WNL on current dose of 75 mcg daily .  No current changes needed.   Lab Results  Component Value Date   TSH 2.56 11/24/2019

## 2019-11-25 NOTE — Assessment & Plan Note (Signed)

## 2019-11-26 LAB — CYTOLOGY - PAP
Comment: NEGATIVE
Diagnosis: NEGATIVE
High risk HPV: NEGATIVE

## 2019-11-27 NOTE — Progress Notes (Signed)
  I am happy to inform you that your PAP smear was normal,  And your  HPV screen was negative.  We will repeat your cervical cancer screening in 3 years .  Regards,   Lempi Edwin, MD    

## 2020-01-23 ENCOUNTER — Other Ambulatory Visit: Payer: Self-pay | Admitting: Internal Medicine

## 2020-02-22 ENCOUNTER — Other Ambulatory Visit: Payer: Self-pay | Admitting: Internal Medicine

## 2020-03-23 ENCOUNTER — Other Ambulatory Visit: Payer: Self-pay | Admitting: Internal Medicine

## 2020-03-28 DIAGNOSIS — D2262 Melanocytic nevi of left upper limb, including shoulder: Secondary | ICD-10-CM | POA: Diagnosis not present

## 2020-03-28 DIAGNOSIS — X32XXXA Exposure to sunlight, initial encounter: Secondary | ICD-10-CM | POA: Diagnosis not present

## 2020-03-28 DIAGNOSIS — D225 Melanocytic nevi of trunk: Secondary | ICD-10-CM | POA: Diagnosis not present

## 2020-03-28 DIAGNOSIS — L570 Actinic keratosis: Secondary | ICD-10-CM | POA: Diagnosis not present

## 2020-03-28 DIAGNOSIS — D2272 Melanocytic nevi of left lower limb, including hip: Secondary | ICD-10-CM | POA: Diagnosis not present

## 2020-03-28 DIAGNOSIS — D485 Neoplasm of uncertain behavior of skin: Secondary | ICD-10-CM | POA: Diagnosis not present

## 2020-03-28 DIAGNOSIS — D2261 Melanocytic nevi of right upper limb, including shoulder: Secondary | ICD-10-CM | POA: Diagnosis not present

## 2020-05-22 ENCOUNTER — Other Ambulatory Visit: Payer: Self-pay | Admitting: Internal Medicine

## 2020-07-31 ENCOUNTER — Other Ambulatory Visit (HOSPITAL_COMMUNITY): Payer: Self-pay

## 2020-10-24 ENCOUNTER — Other Ambulatory Visit: Payer: Self-pay | Admitting: Internal Medicine

## 2020-11-27 ENCOUNTER — Other Ambulatory Visit: Payer: Self-pay

## 2020-11-27 ENCOUNTER — Encounter: Payer: Self-pay | Admitting: Internal Medicine

## 2020-11-27 ENCOUNTER — Ambulatory Visit (INDEPENDENT_AMBULATORY_CARE_PROVIDER_SITE_OTHER): Payer: BC Managed Care – PPO | Admitting: Internal Medicine

## 2020-11-27 VITALS — BP 148/90 | HR 67 | Temp 96.4°F | Ht 60.25 in | Wt 144.0 lb

## 2020-11-27 DIAGNOSIS — Z634 Disappearance and death of family member: Secondary | ICD-10-CM

## 2020-11-27 DIAGNOSIS — Z0000 Encounter for general adult medical examination without abnormal findings: Secondary | ICD-10-CM | POA: Diagnosis not present

## 2020-11-27 DIAGNOSIS — E034 Atrophy of thyroid (acquired): Secondary | ICD-10-CM

## 2020-11-27 DIAGNOSIS — Z1231 Encounter for screening mammogram for malignant neoplasm of breast: Secondary | ICD-10-CM | POA: Diagnosis not present

## 2020-11-27 DIAGNOSIS — E663 Overweight: Secondary | ICD-10-CM

## 2020-11-27 DIAGNOSIS — E7800 Pure hypercholesterolemia, unspecified: Secondary | ICD-10-CM

## 2020-11-27 DIAGNOSIS — Z1211 Encounter for screening for malignant neoplasm of colon: Secondary | ICD-10-CM | POA: Diagnosis not present

## 2020-11-27 DIAGNOSIS — F4321 Adjustment disorder with depressed mood: Secondary | ICD-10-CM | POA: Insufficient documentation

## 2020-11-27 DIAGNOSIS — E559 Vitamin D deficiency, unspecified: Secondary | ICD-10-CM

## 2020-11-27 DIAGNOSIS — R7301 Impaired fasting glucose: Secondary | ICD-10-CM | POA: Diagnosis not present

## 2020-11-27 DIAGNOSIS — I10 Essential (primary) hypertension: Secondary | ICD-10-CM | POA: Diagnosis not present

## 2020-11-27 MED ORDER — TRIAMTERENE-HCTZ 37.5-25 MG PO TABS: 1.0000 | ORAL_TABLET | Freq: Every day | ORAL | 1 refills | Status: DC

## 2020-11-27 MED ORDER — SYNTHROID 75 MCG PO TABS: 75.0000 ug | ORAL_TABLET | Freq: Every day | ORAL | 1 refills | Status: DC

## 2020-11-27 MED ORDER — ZOSTER VAC RECOMB ADJUVANTED 50 MCG/0.5ML IM SUSR: 0.5000 mL | Freq: Once | INTRAMUSCULAR | 1 refills | Status: AC

## 2020-11-27 MED ORDER — SIMVASTATIN 20 MG PO TABS: 20.0000 mg | ORAL_TABLET | Freq: Every evening | ORAL | 1 refills | Status: DC

## 2020-11-27 NOTE — Assessment & Plan Note (Signed)

## 2020-11-27 NOTE — Assessment & Plan Note (Signed)
Well controlled on current regimen of maxzide. . Renal function is due ; she is reminded to return every 6 months for surveillance .

## 2020-11-27 NOTE — Assessment & Plan Note (Signed)
Checking a1c today.  ?

## 2020-11-27 NOTE — Patient Instructions (Addendum)
Referral for colonoscopy is in process    The ShingRx vaccine is now available in local pharmacies and is much more protective than the old one  Zostavax  (it is about 97%  Effective in preventing shingles). .   It is therefore ADVISED for all interested adults over 50 to prevent shingles so I have printed you a prescription for it.  (it requires a 2nd dose 2 to 6 months after the first one) .  It will cause you to have flu  like symptoms for 2 days If your pharmacy is not available to give it to you,  The [facility] will give it to you.  They are located on the ground floor of the [place] clinic   Please schedule a follow up lab visit in 6 months

## 2020-11-27 NOTE — Progress Notes (Signed)
Patient ID: Kaitlyn Cortez, female    DOB: 1961-01-10  Age: 60 y.o. MRN: [id number]  The patient is here for annual preventive examination and management of other chronic and acute problems.  This visit occurred during the SARS-CoV-2 public health emergency.  Safety protocols were in place, including screening questions prior to the visit, additional usage of staff PPE, and extensive cleaning of exam room while observing appropriate contact time as indicated for disinfecting solutions.     The risk factors are reflected in the social history.  The roster of all physicians providing medical care to patient - is listed in the Snapshot section of the chart.  Activities of daily living:  The patient is 100% independent in all ADLs: dressing, toileting, feeding as well as independent mobility  Home safety : The patient has smoke detectors in the home. They wear seatbelts.  There are no firearms at home. There is no violence in the home.   There is no risks for hepatitis, STDs or HIV. There is no   history of blood transfusion. They have no travel history to infectious disease endemic areas of the world.  The patient has seen their dentist in the last six month. They have seen their eye doctor in the last year. They admit to slight hearing difficulty with regard to whispered voices and some television programs.  They have deferred audiologic testing in the last year.  They do not  have excessive sun exposure. Discussed the need for sun protection: hats, long sleeves and use of sunscreen if there is significant sun exposure.   Diet: the importance of a healthy diet is discussed. They do have a healthy diet.  The benefits of regular aerobic exercise were discussed. She walks 4 times per week ,  20 minutes.   Depression screen: there are no signs or vegative symptoms of depression- irritability, change in appetite, anhedonia, sadness/tearfullness.  Cognitive assessment: the patient manages all their  financial and personal affairs and is actively engaged. They could relate day,date,year and events; recalled 2/3 objects at 3 minutes; performed clock-face test normally.  The following portions of the patient's history were reviewed and updated as appropriate: allergies, current medications, past family history, past medical history,  past surgical history, past social history  and problem list.  Visual acuity was not assessed per patient preference since she has regular follow up with her ophthalmologist. Hearing and body mass index were assessed and reviewed.   During the course of the visit the patient was educated and counseled about appropriate screening and preventive services including : fall prevention , diabetes screening, nutrition counseling, colorectal cancer screening, and recommended immunizations.    CC: The primary encounter diagnosis was Visit for preventive health examination. Diagnoses of Colon cancer screening, Pure hypercholesterolemia, Vitamin D deficiency, Primary hypertension, Hypothyroidism due to acquired atrophy of thyroid, Encounter for screening mammogram for malignant neoplasm of breast, IFG (impaired fasting glucose), Grief at loss of child, and Overweight (BMI 25.0-29.9) were also pertinent to this visit.  1) hypothyroid: last checked Oct 2021  2) hyperliidemia: Last checked Oct 21  3) hypertension : bp elevated today.   4) cervical CA screening last done in 2021  WNL  5) colon ca screening normal  colonoscopy 2011   6) breast ca screening last done Sept 2021 at [facility].  States that it is scheduled   7)Vitamin D deficiency : no longer taking a low dose supplement  History Kaitlyn Cortez has a past medical history of Anemia,  Diffuse cystic mastopathy (2011), Hyperlipidemia, Hypertension, hypothyroidism, Menopausal menorrhagia (2009), Overweight (BMI 25.0-29.9), and Unspecified disorder of thyroid (2011).   She has a past surgical history that includes Biopsy  thyroid (2010); Tubal ligation; Breast biopsy (2011); Hysteroscopy (2009); and Colonoscopy (2011).   Her family history includes CAD (age of onset: 85) in her mother; Heart disease in her maternal uncle; Heart disease (age of onset: 68) in her father.She reports that she has never smoked. She has never used smokeless tobacco. She reports current alcohol use. She reports that she does not use drugs.  Outpatient Medications Prior to Visit  Medication Sig Dispense Refill   simvastatin (ZOCOR) 20 MG tablet TAKE ONE TABLET BY MOUTH EVERY EVENING 90 tablet 1   SYNTHROID 75 MCG tablet TAKE ONE TABLET BY MOUTH DAILY 30 tablet 1   triamterene-hydrochlorothiazide (MAXZIDE-25) 37.5-25 MG tablet TAKE ONE TABLET BY MOUTH DAILY 90 tablet 1   No facility-administered medications prior to visit.    Review of Systems  Patient denies headache, fevers, malaise, unintentional weight loss, skin rash, eye pain, sinus congestion and sinus pain, sore throat, dysphagia,  hemoptysis , cough, dyspnea, wheezing, chest pain, palpitations, orthopnea, edema, abdominal pain, nausea, melena, diarrhea, constipation, flank pain, dysuria, hematuria, urinary  Frequency, nocturia, numbness, tingling, seizures,  Focal weakness, Loss of consciousness,  Tremor, insomnia, depression, anxiety, and suicidal ideation.     Objective:  BP (!) 148/90 (BP Location: Left Arm, Patient Position: Sitting, Cuff Size: Normal)   Pulse 67   Temp (!) 96.4 F (35.8 C) (Temporal)   Ht 5' 0.25" (1.53 m)   Wt 144 lb (65.3 kg)   SpO2 99%   BMI 27.89 kg/m   Physical Exam  General appearance: alert, cooperative and appears stated age Head: Normocephalic, without obvious abnormality, atraumatic Eyes: conjunctivae/corneas clear. PERRL, EOM's intact. Fundi benign. Ears: normal TM's and external ear canals both ears Nose: Nares normal. Septum midline. Mucosa normal. No drainage or sinus tenderness. Throat: lips, mucosa, and tongue normal; teeth and  gums normal Neck: no adenopathy, no carotid bruit, no JVD, supple, symmetrical, trachea midline and thyroid not enlarged, symmetric, no tenderness/mass/nodules Lungs: clear to auscultation bilaterally Breasts: normal appearance, no masses or tenderness Heart: regular rate and rhythm, S1, S2 normal, no murmur, click, rub or gallop Abdomen: soft, non-tender; bowel sounds normal; no masses,  no organomegaly Extremities: extremities normal, atraumatic, no cyanosis or edema Pulses: 2+ and symmetric Skin: Skin color, texture, turgor normal. No rashes or lesions Neurologic: Alert and oriented X 3, normal strength and tone. Normal symmetric reflexes. Normal coordination and gait.     Assessment & Plan:   Problem List Items Addressed This Visit       Unprioritized   Hypertension (Chronic)    Well controlled on current regimen of maxzide. . Renal function is due ; she is reminded to return every 6 months for surveillance .      Relevant Medications   simvastatin (ZOCOR) 20 MG tablet   triamterene-hydrochlorothiazide (MAXZIDE-25) 37.5-25 MG tablet   Other Relevant Orders   Comprehensive metabolic panel   Comprehensive metabolic panel   Hypothyroidism   Relevant Orders   TSH   Vitamin D deficiency   Relevant Orders   VITAMIN D 25 Hydroxy (Vit-D Deficiency, Fractures)   Overweight (BMI 25.0-29.9)    Checking a1c today      Hyperlipidemia    Managed with simvastatin, repeat labs are due   Lab Results  Component Value Date   CHOL 149 11/24/2019  HDL 36.90 (L) 11/24/2019   LDLCALC 76 11/24/2019   LDLDIRECT 84.0 11/23/2018   TRIG 179.0 (H) 11/24/2019   CHOLHDL 4 11/24/2019         Relevant Medications   simvastatin (ZOCOR) 20 MG tablet   triamterene-hydrochlorothiazide (MAXZIDE-25) 37.5-25 MG tablet   Other Relevant Orders   Lipid panel   Grief at loss of child    Her son died 1.5 years ago and she is still grieving but does not screen positive for depression. . She  maintains an active social life,  Is sleeping well, and performing well at work .   She declines referral for counselling and/or medications.        Visit for preventive health examination - Primary    age appropriate education and counseling updated, referrals for preventative services and immunizations addressed, dietary and smoking counseling addressed, most recent labs reviewed.  I have personally reviewed and have noted:   1) the patient's medical and social history 2) The pt's use of alcohol, tobacco, and illicit drugs 3) The patient's current medications and supplements 4) Functional ability including ADL's, fall risk, home safety risk, hearing and visual impairment 5) Diet and physical activities 6) Evidence for depression or mood disorder 7) The patient's height, weight, and BMI have been recorded in the chart   I have made referrals, and provided counseling and education based on review of the above      Other Visit Diagnoses     Colon cancer screening       Relevant Medications   simvastatin (ZOCOR) 20 MG tablet   Other Relevant Orders   Ambulatory referral to Gastroenterology   Encounter for screening mammogram for malignant neoplasm of breast       IFG (impaired fasting glucose)       Relevant Orders   Hemoglobin A1c       I have discontinued Lezlie Octave. Kaitlyn Cortez's Synthroid and Synthroid. I have also changed her simvastatin and triamterene-hydrochlorothiazide. Additionally, I am having her start on Zoster Vaccine Adjuvanted.  Meds ordered this encounter  Medications   simvastatin (ZOCOR) 20 MG tablet    Sig: Take 1 tablet (20 mg total) by mouth every evening.    Dispense:  90 tablet    Refill:  1   DISCONTD: SYNTHROID 75 MCG tablet    Sig: Take 1 tablet (75 mcg total) by mouth daily.    Dispense:  90 tablet    Refill:  1   triamterene-hydrochlorothiazide (MAXZIDE-25) 37.5-25 MG tablet    Sig: Take 1 tablet by mouth daily.    Dispense:  90 tablet    Refill:   1   Zoster Vaccine Adjuvanted [facility]) injection    Sig: Inject 0.5 mLs into the muscle once for 1 dose.    Dispense:  1 each    Refill:  1    Medications Discontinued During This Encounter  Medication Reason   simvastatin (ZOCOR) 20 MG tablet Reorder   triamterene-hydrochlorothiazide (MAXZIDE-25) 37.5-25 MG tablet Reorder   SYNTHROID 75 MCG tablet Reorder   SYNTHROID 75 MCG tablet     Follow-up: No follow-ups on file.   Crecencio Mc, MD

## 2020-11-27 NOTE — Assessment & Plan Note (Addendum)
Her son died 1.5 years ago and she is still grieving but does not screen positive for depression. . She maintains an active social life,  Is sleeping well, and performing well at work .   She declines referral for counselling and/or medications.

## 2020-11-28 LAB — LIPID PANEL
Cholesterol: 201 mg/dL — ABNORMAL HIGH (ref 0–200)
HDL: 49.6 mg/dL (ref >39.00)
LDL Cholesterol: 120 mg/dL — ABNORMAL HIGH (ref 0–99)
NonHDL: 151.8
Total CHOL/HDL Ratio: 4
Triglycerides: 161 mg/dL — ABNORMAL HIGH (ref 0.0–149.0)
VLDL: 32.2 mg/dL (ref 0.0–40.0)

## 2020-11-28 LAB — COMPREHENSIVE METABOLIC PANEL
ALT: 13 U/L (ref 0–35)
AST: 18 U/L (ref 0–37)
Albumin: 4.7 g/dL (ref 3.5–5.2)
Alkaline Phosphatase: 67 U/L (ref 39–117)
BUN: 13 mg/dL (ref 6–23)
CO2: 27 mEq/L (ref 19–32)
Calcium: 10 mg/dL (ref 8.4–10.5)
Chloride: 101 mEq/L (ref 96–112)
Creatinine, Ser: 0.68 mg/dL (ref 0.40–1.20)
GFR: 93.18 mL/min (ref >60.00)
Glucose, Bld: 80 mg/dL (ref 70–99)
Potassium: 4.3 mEq/L (ref 3.5–5.1)
Sodium: 137 mEq/L (ref 135–145)
Total Bilirubin: 1.3 mg/dL — ABNORMAL HIGH (ref 0.2–1.2)
Total Protein: 7.3 g/dL (ref 6.0–8.3)

## 2020-11-28 LAB — TSH: TSH: 2.11 u[IU]/mL (ref 0.35–5.50)

## 2020-11-28 LAB — HEMOGLOBIN A1C: Hgb A1c MFr Bld: 5.6 % (ref 4.6–6.5)

## 2020-11-28 LAB — VITAMIN D 25 HYDROXY (VIT D DEFICIENCY, FRACTURES): VITD: 26.39 ng/mL — ABNORMAL LOW (ref 30.00–100.00)

## 2020-11-28 NOTE — Assessment & Plan Note (Signed)
Managed with simvastatin, repeat labs are due   Lab Results  Component Value Date   CHOL 149 11/24/2019   HDL 36.90 (L) 11/24/2019   LDLCALC 76 11/24/2019   LDLDIRECT 84.0 11/23/2018   TRIG 179.0 (H) 11/24/2019   CHOLHDL 4 11/24/2019

## 2020-11-29 ENCOUNTER — Other Ambulatory Visit: Payer: Self-pay

## 2020-11-29 DIAGNOSIS — Z1211 Encounter for screening for malignant neoplasm of colon: Secondary | ICD-10-CM

## 2020-11-29 MED ORDER — NA SULFATE-K SULFATE-MG SULF 17.5-3.13-1.6 GM/177ML PO SOLN: 1.0000 | Freq: Once | ORAL | 0 refills | Status: AC

## 2020-11-29 NOTE — Progress Notes (Signed)
Gastroenterology Pre-Procedure Review  Request Date: 03/05/2021 Requesting Physician: Dr. Vicente Males  PATIENT REVIEW QUESTIONS: The patient responded to the following health history questions as indicated:    1. Are you having any GI issues? no 2. Do you have a personal history of Polyps? no 3. Do you have a family history of Colon Cancer or Polyps? no 4. Diabetes Mellitus? no 5. Joint replacements in the past 12 months?no 6. Major health problems in the past 3 months?no 7. Any artificial heart valves, MVP, or defibrillator?no    MEDICATIONS & ALLERGIES:    Patient reports the following regarding taking any anticoagulation/antiplatelet therapy:   Plavix, Coumadin, Eliquis, Xarelto, Lovenox, Pradaxa, Brilinta, or Effient? no Aspirin? no  Patient confirms/reports the following medications:  Current Outpatient Medications  Medication Sig Dispense Refill   simvastatin (ZOCOR) 20 MG tablet Take 1 tablet (20 mg total) by mouth every evening. 90 tablet 1   triamterene-hydrochlorothiazide (MAXZIDE-25) 37.5-25 MG tablet Take 1 tablet by mouth daily. 90 tablet 1   No current facility-administered medications for this visit.    Patient confirms/reports the following allergies:  No Known Allergies  No orders of the defined types were placed in this encounter.   AUTHORIZATION INFORMATION Primary Insurance: 1D#: Group #:  Secondary Insurance: 1D#: Group #:  SCHEDULE INFORMATION: Date: 03/05/2021 Time: Location: [place]

## 2020-11-29 NOTE — Progress Notes (Signed)
Your CBC, A1c, thyroid ,cholesterol,  liver and kidney function are normal.  Your vitamin D is borderline low.  If you are not taking a D3 supplement,  please start taking 1000 [place] daily.  If you already are ,  Please increase to 2000 IUs daily for the winter months. . Low Vitamin D can increase your risk of weak bones and fractures and interfere with your body's ability to absorb the calcium in your diet You are not anemic or prediabetic    Please Plan to repeat fasting labs in 1 year.   Regards,  Dr. Derrel Nip

## 2020-12-06 DIAGNOSIS — Z1231 Encounter for screening mammogram for malignant neoplasm of breast: Secondary | ICD-10-CM | POA: Diagnosis not present

## 2020-12-06 DIAGNOSIS — Z1239 Encounter for other screening for malignant neoplasm of breast: Secondary | ICD-10-CM | POA: Diagnosis not present

## 2020-12-06 LAB — HM MAMMOGRAPHY

## 2021-03-01 ENCOUNTER — Encounter: Payer: Self-pay | Admitting: Internal Medicine

## 2021-03-01 ENCOUNTER — Ambulatory Visit (INDEPENDENT_AMBULATORY_CARE_PROVIDER_SITE_OTHER): Payer: BC Managed Care – PPO | Admitting: Internal Medicine

## 2021-03-01 ENCOUNTER — Other Ambulatory Visit: Payer: Self-pay

## 2021-03-01 VITALS — BP 114/82 | HR 88 | Temp 97.5°F | Ht 60.25 in | Wt 145.2 lb

## 2021-03-01 DIAGNOSIS — L249 Irritant contact dermatitis, unspecified cause: Secondary | ICD-10-CM

## 2021-03-01 DIAGNOSIS — Z23 Encounter for immunization: Secondary | ICD-10-CM | POA: Diagnosis not present

## 2021-03-01 MED ORDER — HYDROCORTISONE 2.5 % EX OINT: TOPICAL_OINTMENT | Freq: Two times a day (BID) | CUTANEOUS | 2 refills | Status: DC

## 2021-03-01 NOTE — Progress Notes (Signed)
Chief Complaint  Patient presents with   Gynecologic Exam    Tenderness and discomfort in the perineum. Slight burning as well.    F/u  1. Skin between vaginal lip and anus irritated with wiping sex using any kind of soap otc nothing tried it feels tender and burning    Review of Systems  Constitutional:  Negative for weight loss.  HENT:  Negative for hearing loss.   Eyes:  Negative for blurred vision.  Respiratory:  Negative for shortness of breath.   Cardiovascular:  Negative for chest pain.  Gastrointestinal:  Negative for abdominal pain and blood in stool.  Genitourinary:  Negative for dysuria.  Musculoskeletal:  Negative for falls and joint pain.  Skin:  Negative for rash.  Neurological:  Negative for headaches.  Psychiatric/Behavioral:  Negative for depression.   Past Medical History:  Diagnosis Date   Anemia    Diffuse cystic mastopathy 2011   Hyperlipidemia    Hypertension    hypothyroidism    Menopausal menorrhagia 2009   hysterosocopy was normal,  menses ended 2009   Overweight (BMI 25.0-29.9)    Unspecified disorder of thyroid 2011   Past Surgical History:  Procedure Laterality Date   BIOPSY THYROID  2010   normal, Jamal Collin  takes low dose synthroid for suppression   BREAST BIOPSY  2011   fibrocystic breast disease   COLONOSCOPY  2011   HYSTEROSCOPY  2009   TUBAL LIGATION     Family History  Problem Relation Age of Onset   Heart disease Father 68   Heart disease Maternal Uncle    CAD Mother 34   Social History   Socioeconomic History   Marital status: Married    Spouse name: Not on file   Number of children: Not on file   Years of education: Not on file   Highest education level: Not on file  Occupational History   Not on file  Tobacco Use   Smoking status: Never   Smokeless tobacco: Never  Substance and Sexual Activity   Alcohol use: Yes   Drug use: No   Sexual activity: Not on file  Other Topics Concern   Not on file  Social History  Narrative   Married   Social Determinants of Health   Financial Resource Strain: Not on file  Food Insecurity: Not on file  Transportation Needs: Not on file  Physical Activity: Not on file  Stress: Not on file  Social Connections: Not on file  Intimate Partner Violence: Not on file   Current Meds  Medication Sig   hydrocortisone 2.5 % ointment Apply topically 2 (two) times daily. Prn vaginal area   Levothyroxine Sodium (SYNTHROID PO) Take by mouth daily.   simvastatin (ZOCOR) 20 MG tablet Take 1 tablet (20 mg total) by mouth every evening.   triamterene-hydrochlorothiazide (MAXZIDE-25) 37.5-25 MG tablet Take 1 tablet by mouth daily.   No Known Allergies Recent Results (from the past 2160 hour(s))  HM MAMMOGRAPHY     Status: None   Collection Time: 12/06/20 12:00 AM  Result Value Ref Range   HM Mammogram 0-4 Bi-Rad 0-4 Bi-Rad, Self Reported Normal   Objective  Body mass index is 28.12 kg/m. Wt Readings from Last 3 Encounters:  03/01/21 145 lb 3.2 oz (65.9 kg)  11/27/20 144 lb (65.3 kg)  11/24/19 142 lb 3.2 oz (64.5 kg)   Temp Readings from Last 3 Encounters:  03/01/21 (!) 97.5 F (36.4 C) (Temporal)  11/27/20 (!) 96.4 F (35.8 C) (  Temporal)  11/24/19 97.7 F (36.5 C) (Oral)   BP Readings from Last 3 Encounters:  03/01/21 114/82  11/27/20 (!) 148/90  11/24/19 120/74   Pulse Readings from Last 3 Encounters:  03/01/21 88  11/27/20 67  11/24/19 70    Physical Exam Vitals and nursing note reviewed.  Constitutional:      Appearance: Normal appearance. She is well-developed and well-groomed.  HENT:     Head: Normocephalic and atraumatic.  Eyes:     Conjunctiva/sclera: Conjunctivae normal.     Pupils: Pupils are equal, round, and reactive to light.  Cardiovascular:     Rate and Rhythm: Normal rate and regular rhythm.     Heart sounds: Normal heart sounds. No murmur heard. Pulmonary:     Effort: Pulmonary effort is normal.     Breath sounds: Normal breath  sounds.  Abdominal:     General: Abdomen is flat. Bowel sounds are normal.     Tenderness: There is no abdominal tenderness.  Genitourinary:    Pubic Area: No rash.      Labia:        Right: Rash present.      Rectum: External hemorrhoid present.    Musculoskeletal:        General: No tenderness.  Skin:    General: Skin is warm and dry.  Neurological:     General: No focal deficit present.     Mental Status: She is alert and oriented to person, place, and time. Mental status is at baseline.     Cranial Nerves: Cranial nerves 2-12 are intact.     Sensory: Sensation is intact.     Motor: Motor function is intact.     Coordination: Coordination is intact.     Gait: Gait is intact.  Psychiatric:        Attention and Perception: Attention and perception normal.        Mood and Affect: Mood and affect normal.        Speech: Speech normal.        Behavior: Behavior normal. Behavior is cooperative.        Thought Content: Thought content normal.        Cognition and Memory: Cognition and memory normal.        Judgment: Judgment normal.    Assessment  Plan  Irritant contact dermatitis, unspecified trigger - Plan: hydrocortisone 2.5 % ointment Dove unscented bar soap or vagisil or summers eve  Skin irritation  2x per day  Diaper rash cream I.e Desitin (with zinc oxide) or alternative + Vasoline  +  Antifungal cream (clotrimazole/Lamisil)  +  Hydrocortisone ointment 2.5 if not better will rx clobestasol    Needs flu shot - Plan: Flu Vaccine QUAD 6+ mos PF IM (Fluarix Quad PF)  Provider: Dr. Olivia Mackie McLean-Scocuzza-Internal Medicine

## 2021-03-01 NOTE — Patient Instructions (Addendum)
Dove unscented bar soap or vagisil or summers eve  Skin irritation  2x per day  Diaper rash cream I.e Desitin (with zinc oxide) or alternative + Vasoline  +  Antifungal cream (clotrimazole/Lamisil)  +  Hydrocortisone ointment

## 2021-03-05 ENCOUNTER — Other Ambulatory Visit: Payer: Self-pay

## 2021-03-05 ENCOUNTER — Ambulatory Visit: Payer: BC Managed Care – PPO | Admitting: Certified Registered Nurse Anesthetist

## 2021-03-05 ENCOUNTER — Encounter
Admission: RE | Disposition: A | Discharge status: Home / Self Care | Payer: Self-pay | Source: Home / Self Care | Attending: Gastroenterology

## 2021-03-05 ENCOUNTER — Encounter: Payer: Self-pay | Admitting: Gastroenterology

## 2021-03-05 ENCOUNTER — Ambulatory Visit
Admission: RE | Admit: 2021-03-05 | Discharge: 2021-03-05 | Disposition: A | Discharge status: Home / Self Care | Payer: BC Managed Care – PPO | Attending: Gastroenterology | Admitting: Gastroenterology

## 2021-03-05 DIAGNOSIS — K5730 Diverticulosis of large intestine without perforation or abscess without bleeding: Secondary | ICD-10-CM | POA: Diagnosis not present

## 2021-03-05 DIAGNOSIS — D122 Benign neoplasm of ascending colon: Secondary | ICD-10-CM | POA: Insufficient documentation

## 2021-03-05 DIAGNOSIS — Z1211 Encounter for screening for malignant neoplasm of colon: Secondary | ICD-10-CM | POA: Diagnosis not present

## 2021-03-05 DIAGNOSIS — I10 Essential (primary) hypertension: Secondary | ICD-10-CM | POA: Insufficient documentation

## 2021-03-05 DIAGNOSIS — E039 Hypothyroidism, unspecified: Secondary | ICD-10-CM | POA: Diagnosis not present

## 2021-03-05 DIAGNOSIS — K635 Polyp of colon: Secondary | ICD-10-CM | POA: Diagnosis not present

## 2021-03-05 HISTORY — PX: COLONOSCOPY WITH PROPOFOL: SHX5780

## 2021-03-05 SURGERY — COLONOSCOPY WITH PROPOFOL
Anesthesia: General

## 2021-03-05 MED ORDER — STERILE WATER FOR IRRIGATION IR SOLN
Status: DC | PRN
  Administered 2021-03-05: 60 mL

## 2021-03-05 MED ORDER — LIDOCAINE HCL (CARDIAC) PF 100 MG/5ML IV SOSY
PREFILLED_SYRINGE | INTRAVENOUS | Status: DC | PRN
  Administered 2021-03-05: 50 mg via INTRAVENOUS

## 2021-03-05 MED ORDER — PROPOFOL 10 MG/ML IV BOLUS
INTRAVENOUS | Status: DC | PRN
  Administered 2021-03-05: 70 mg via INTRAVENOUS
  Administered 2021-03-05: 20 mg via INTRAVENOUS

## 2021-03-05 MED ORDER — SODIUM CHLORIDE 0.9 % IV SOLN: INTRAVENOUS | Status: DC

## 2021-03-05 MED ORDER — PROPOFOL 500 MG/50ML IV EMUL
INTRAVENOUS | Status: AC
  Filled 2021-03-05: qty 50

## 2021-03-05 MED ORDER — LIDOCAINE HCL (PF) 2 % IJ SOLN
INTRAMUSCULAR | Status: AC
  Filled 2021-03-05: qty 5

## 2021-03-05 MED ORDER — PROPOFOL 500 MG/50ML IV EMUL
INTRAVENOUS | Status: DC | PRN
  Administered 2021-03-05: 150 ug/kg/min via INTRAVENOUS

## 2021-03-05 NOTE — Transfer of Care (Signed)
Immediate Anesthesia Transfer of Care Note  Patient: Kaitlyn Cortez  Procedure(s) Performed: COLONOSCOPY WITH PROPOFOL  Patient Location: PACU  Anesthesia Type:General  Level of Consciousness: awake and alert   Airway & Oxygen Therapy: Patient Spontanous Breathing and Patient connected to nasal cannula oxygen  Post-op Assessment: Report given to RN and Post -op Vital signs reviewed and stable  Post vital signs: Reviewed and stable  Last Vitals:  Vitals Value Taken Time  BP    Temp    Pulse 77 03/05/21 0841  Resp 14 03/05/21 0841  SpO2 99 % 03/05/21 0841  Vitals shown include unvalidated device data.  Last Pain:  Vitals:   03/05/21 0726  TempSrc: Temporal  PainSc: 0-No pain         Complications: No notable events documented.

## 2021-03-05 NOTE — Anesthesia Preprocedure Evaluation (Signed)
Anesthesia Evaluation  Patient identified by MRN, date of birth, ID band Patient awake    Reviewed: Allergy & Precautions, H&P , NPO status , Patient's Chart, lab work & pertinent test results, reviewed documented beta blocker date and time   History of Anesthesia Complications Negative for: history of anesthetic complications  Airway Mallampati: II  TM Distance: >3 FB Neck ROM: full    Dental  (+) Dental Advidsory Given, Caps, Teeth Intact   Pulmonary neg pulmonary ROS,    Pulmonary exam normal breath sounds clear to auscultation       Cardiovascular Exercise Tolerance: Good hypertension, (-) angina(-) Past MI and (-) Cardiac Stents Normal cardiovascular exam(-) dysrhythmias (-) Valvular Problems/Murmurs Rhythm:regular Rate:Normal     Neuro/Psych negative neurological ROS  negative psych ROS   GI/Hepatic negative GI ROS, Neg liver ROS,   Endo/Other  neg diabetesHypothyroidism   Renal/GU negative Renal ROS  negative genitourinary   Musculoskeletal   Abdominal   Peds  Hematology negative hematology ROS (+)   Anesthesia Other Findings Past Medical History: No date: Anemia 2011: Diffuse cystic mastopathy No date: Hyperlipidemia No date: Hypertension No date: hypothyroidism 2009: Menopausal menorrhagia     Comment:  hysterosocopy was normal,  menses ended 2009 No date: Overweight (BMI 25.0-29.9) 2011: Unspecified disorder of thyroid   Reproductive/Obstetrics negative OB ROS                             Anesthesia Physical Anesthesia Plan  ASA: 2  Anesthesia Plan: General   Post-op Pain Management:    Induction: Inhalational  PONV Risk Score and Plan: 3 and Propofol infusion and TIVA  Airway Management Planned: Natural Airway and Nasal Cannula  Additional Equipment:   Intra-op Plan:   Post-operative Plan:   Informed Consent: I have reviewed the patients History and  Physical, chart, labs and discussed the procedure including the risks, benefits and alternatives for the proposed anesthesia with the patient or authorized representative who has indicated his/her understanding and acceptance.     Dental Advisory Given  Plan Discussed with: Anesthesiologist, CRNA and Surgeon  Anesthesia Plan Comments:         Anesthesia Quick Evaluation

## 2021-03-05 NOTE — H&P (Signed)
Kaitlyn Bellows, MD [address] [place], [place], [place], [address] [address], [place], [place], [place], [address] Phone: [phone]  Fax: [phone]  Primary Care Physician:  Crecencio Mc, MD   Pre-Procedure History & Physical: HPI:  Kaitlyn Cortez is a 61 y.o. female is here for an colonoscopy.   Past Medical History:  Diagnosis Date   Anemia    Diffuse cystic mastopathy 2011   Hyperlipidemia    Hypertension    hypothyroidism    Menopausal menorrhagia 2009   hysterosocopy was normal,  menses ended 2009   Overweight (BMI 25.0-29.9)    Unspecified disorder of thyroid 2011    Past Surgical History:  Procedure Laterality Date   BIOPSY THYROID  2010   normal, Kaitlyn Cortez  takes low dose synthroid for suppression   BREAST BIOPSY  2011   fibrocystic breast disease   COLONOSCOPY  2011   HYSTEROSCOPY  2009   TUBAL LIGATION      Prior to Admission medications   Medication Sig Start Date End Date Taking? Authorizing Provider  hydrocortisone 2.5 % ointment Apply topically 2 (two) times daily. Prn vaginal area 03/01/21  Yes McLean-Scocuzza, Nino Glow, MD  Levothyroxine Sodium (SYNTHROID PO) Take by mouth daily.   Yes [provider]  simvastatin (ZOCOR) 20 MG tablet Take 1 tablet (20 mg total) by mouth every evening. 11/27/20  Yes Crecencio Mc, MD  triamterene-hydrochlorothiazide (MAXZIDE-25) 37.5-25 MG tablet Take 1 tablet by mouth daily. 11/27/20  Yes Crecencio Mc, MD    Allergies as of 11/29/2020   (No Known Allergies)    Family History  Problem Relation Age of Onset   Heart disease Father 4   Heart disease Maternal Uncle    CAD Mother 51    Social History   Socioeconomic History   Marital status: Married    Spouse name: Not on file   Number of children: Not on file   Years of education: Not on file   Highest education level: Not on file  Occupational History   Not on file  Tobacco Use   Smoking status: Never   Smokeless  tobacco: Never  Vaping Use   Vaping Use: Never used  Substance and Sexual Activity   Alcohol use: Yes    Comment: occ   Drug use: No   Sexual activity: Not on file  Other Topics Concern   Not on file  Social History Narrative   Married   Social Determinants of Health   Financial Resource Strain: Not on file  Food Insecurity: Not on file  Transportation Needs: Not on file  Physical Activity: Not on file  Stress: Not on file  Social Connections: Not on file  Intimate Partner Violence: Not on file    Review of Systems: See HPI, otherwise negative ROS  Physical Exam: BP 119/74    Pulse 77    Temp (!) 97 F (36.1 C) (Temporal)    Resp 16    Ht 5\' 1"  (1.549 m)    Wt 64.4 kg    SpO2 98%    BMI 26.83 kg/m  General:   Alert,  pleasant and cooperative in NAD Head:  Normocephalic and atraumatic. Neck:  Supple; no masses or thyromegaly. Lungs:  Clear throughout to auscultation, normal respiratory effort.    Heart:  +S1, +S2, Regular rate and rhythm, No edema. Abdomen:  Soft, nontender and nondistended. Normal bowel sounds, without guarding, and without rebound.   Neurologic:  Alert and  oriented x4;  grossly normal neurologically.  Impression/Plan: JAIMA Kaitlyn Cortez is here for an colonoscopy to be performed for Screening colonoscopy average risk   Risks, benefits, limitations, and alternatives regarding  colonoscopy have been reviewed with the patient.  Questions have been answered.  All parties agreeable.   Kaitlyn Bellows, MD  03/05/2021, 9:19 AM

## 2021-03-05 NOTE — Op Note (Signed)
[facility] Gastroenterology Patient Name: Kaitlyn Cortez Procedure Date: 03/05/2021 8:14 AM MRN: [id number] Account #: [id number] Date of Birth: 31-Aug-1960 Admit Type: Outpatient Age: 61 Room: [facility] ENDO ROOM 4 Gender: Female Note Status: Finalized Instrument Name: Jasper Riling [id number] Procedure:             Colonoscopy Indications:           Screening for colorectal malignant neoplasm Providers:             Jonathon Bellows MD, MD Referring MD:          Deborra Medina, MD (Referring MD) Medicines:             Monitored Anesthesia Care Complications:         No immediate complications. Procedure:             Pre-Anesthesia Assessment:                        - Prior to the procedure, a History and Physical was                         performed, and patient medications, allergies and                         sensitivities were reviewed. The patient's tolerance                         of previous anesthesia was reviewed.                        - The risks and benefits of the procedure and the                         sedation options and risks were discussed with the                         patient. All questions were answered and informed                         consent was obtained.                        - ASA Grade Assessment: II - A patient with mild                         systemic disease.                        After obtaining informed consent, the colonoscope was                         passed under direct vision. Throughout the procedure,                         the patient's blood pressure, pulse, and oxygen                         saturations were monitored continuously. The                         Colonoscope was introduced  through the anus and                         advanced to the the cecum, identified by the                         appendiceal orifice. The colonoscopy was performed                         with ease. The patient tolerated the procedure well.                          The quality of the bowel preparation was excellent. Findings:      The perianal and digital rectal examinations were normal.      A 3 mm polyp was found in the ascending colon. The polyp was sessile.       The polyp was removed with a cold biopsy forceps. Resection and       retrieval were complete.      Multiple small-mouthed diverticula were found in the entire colon.      The exam was otherwise without abnormality on direct and retroflexion       views. Impression:            - One 3 mm polyp in the ascending colon, removed with                         a cold biopsy forceps. Resected and retrieved.                        - Diverticulosis in the entire examined colon.                        - The examination was otherwise normal on direct and                         retroflexion views. Recommendation:        - Discharge patient to home.                        - Resume previous diet.                        - Continue present medications.                        - Await pathology results.                        - Repeat colonoscopy for surveillance based on                         pathology results. Procedure Code(s):     --- Professional ---                        [phone], Colonoscopy, flexible; with biopsy, single or                         multiple Diagnosis Code(s):     --- Professional ---  Z12.11, Encounter for screening for malignant neoplasm                         of colon                        K63.5, Polyp of colon                        K57.30, Diverticulosis of large intestine without                         perforation or abscess without bleeding CPT copyright 2019 American Medical Association. All rights reserved. The codes documented in this report are preliminary and upon coder review may  be revised to meet current compliance requirements. Jonathon Bellows, MD Jonathon Bellows MD, MD 03/05/2021 8:40:42 AM This report has been signed  electronically. Number of Addenda: 0 Note Initiated On: 03/05/2021 8:14 AM Scope Withdrawal Time: 0 hours 12 minutes 1 second  Total Procedure Duration: 0 hours 15 minutes 51 seconds  Estimated Blood Loss:  Estimated blood loss: none.      [facility]

## 2021-03-06 DIAGNOSIS — D485 Neoplasm of uncertain behavior of skin: Secondary | ICD-10-CM | POA: Diagnosis not present

## 2021-03-06 DIAGNOSIS — L820 Inflamed seborrheic keratosis: Secondary | ICD-10-CM | POA: Diagnosis not present

## 2021-03-06 DIAGNOSIS — D0462 Carcinoma in situ of skin of left upper limb, including shoulder: Secondary | ICD-10-CM | POA: Diagnosis not present

## 2021-03-06 DIAGNOSIS — D2262 Melanocytic nevi of left upper limb, including shoulder: Secondary | ICD-10-CM | POA: Diagnosis not present

## 2021-03-06 DIAGNOSIS — D2261 Melanocytic nevi of right upper limb, including shoulder: Secondary | ICD-10-CM | POA: Diagnosis not present

## 2021-03-06 DIAGNOSIS — D2272 Melanocytic nevi of left lower limb, including hip: Secondary | ICD-10-CM | POA: Diagnosis not present

## 2021-03-06 DIAGNOSIS — D225 Melanocytic nevi of trunk: Secondary | ICD-10-CM | POA: Diagnosis not present

## 2021-03-06 LAB — SURGICAL PATHOLOGY

## 2021-03-06 NOTE — Anesthesia Postprocedure Evaluation (Signed)
Anesthesia Post Note  Patient: Kaitlyn Cortez  Procedure(s) Performed: COLONOSCOPY WITH PROPOFOL  Patient location during evaluation: Endoscopy Anesthesia Type: General Level of consciousness: awake and alert Pain management: pain level controlled Vital Signs Assessment: post-procedure vital signs reviewed and stable Respiratory status: spontaneous breathing, nonlabored ventilation, respiratory function stable and patient connected to nasal cannula oxygen Cardiovascular status: blood pressure returned to baseline and stable Postop Assessment: no apparent nausea or vomiting Anesthetic complications: no   No notable events documented.   Last Vitals:  Vitals:   03/05/21 0726 03/05/21 0841  BP: (!) 145/101 119/74  Pulse: 77   Resp: 16   Temp: (!) 36.2 C (!) 36.1 C  SpO2: 98%     Last Pain:  Vitals:   03/06/21 0735  TempSrc:   PainSc: 0-No pain                 Martha Clan

## 2021-03-07 ENCOUNTER — Encounter: Payer: Self-pay | Admitting: Gastroenterology

## 2021-03-23 DIAGNOSIS — D0462 Carcinoma in situ of skin of left upper limb, including shoulder: Secondary | ICD-10-CM | POA: Diagnosis not present

## 2021-03-23 DIAGNOSIS — L905 Scar conditions and fibrosis of skin: Secondary | ICD-10-CM | POA: Diagnosis not present

## 2021-05-04 ENCOUNTER — Other Ambulatory Visit: Payer: Self-pay

## 2021-05-04 ENCOUNTER — Other Ambulatory Visit (HOSPITAL_COMMUNITY)
Admission: RE | Admit: 2021-05-04 | Discharge: 2021-05-04 | Disposition: A | Discharge status: Home / Self Care | Payer: BC Managed Care – PPO | Source: Ambulatory Visit | Attending: Internal Medicine | Admitting: Internal Medicine

## 2021-05-04 ENCOUNTER — Ambulatory Visit (INDEPENDENT_AMBULATORY_CARE_PROVIDER_SITE_OTHER): Payer: BC Managed Care – PPO | Admitting: Internal Medicine

## 2021-05-04 ENCOUNTER — Encounter: Payer: Self-pay | Admitting: Internal Medicine

## 2021-05-04 VITALS — BP 132/74 | HR 82 | Temp 98.3°F | Ht 61.0 in | Wt 143.6 lb

## 2021-05-04 DIAGNOSIS — Z124 Encounter for screening for malignant neoplasm of cervix: Secondary | ICD-10-CM | POA: Diagnosis not present

## 2021-05-04 DIAGNOSIS — N952 Postmenopausal atrophic vaginitis: Secondary | ICD-10-CM

## 2021-05-04 MED ORDER — ESTRADIOL 10 MCG VA TABS: ORAL_TABLET | VAGINAL | 11 refills | Status: DC

## 2021-05-04 MED ORDER — ESTRADIOL 10 MCG VA TABS: ORAL_TABLET | VAGINAL | 0 refills | Status: DC

## 2021-05-04 NOTE — Progress Notes (Signed)
? ?Subjective:  ?Patient ID: Kaitlyn Cortez, female    DOB: [birth year]  Age: 61 y.o. MRN: [id number] ? ?CC: The primary encounter diagnosis was Cervical cancer screening. A diagnosis of Atrophic vaginitis was also pertinent to this visit. ? ? ?This visit occurred during the SARS-CoV-2 public health emergency.  Safety protocols were in place, including screening questions prior to the visit, additional usage of staff PPE, and extensive cleaning of exam room while observing appropriate contact time as indicated for disinfecting solutions.   ? ?HPI ?Kaitlyn Cortez presents for evaluation and treatment of dyspareunia ?Chief Complaint  ?Patient presents with  ? Acute Visit  ?  Painful intercourse would like to have a pap smear  ? ?61 YR OLD female with no significant PMH   (history of 3 vaginal deliveries)  presents for evaluation of pain with intercourse .  The pain is limited to the introitus and occurs during penetration. She denies post coital bleeding and denies discharge.  She was prescribed hydrocortisone cream by [facility] for vaginal irritation which  did not help, and created too much moisture.  ? ? ?Outpatient Medications Prior to Visit  ?Medication Sig Dispense Refill  ? hydrocortisone 2.5 % ointment Apply topically 2 (two) times daily. Prn vaginal area 30 g 2  ? Levothyroxine Sodium (SYNTHROID PO) Take by mouth daily.    ? simvastatin (ZOCOR) 20 MG tablet Take 1 tablet (20 mg total) by mouth every evening. 90 tablet 1  ? triamterene-hydrochlorothiazide (MAXZIDE-25) 37.5-25 MG tablet Take 1 tablet by mouth daily. 90 tablet 1  ? ?No facility-administered medications prior to visit.  ? ? ?Review of Systems; ? ?Patient denies headache, fevers, malaise, unintentional weight loss, skin rash, eye pain, sinus congestion and sinus pain, sore throat, dysphagia,  hemoptysis , cough, dyspnea, wheezing, chest pain, palpitations, orthopnea, edema, abdominal pain, nausea, melena, diarrhea, constipation, flank pain, dysuria,  hematuria, urinary  Frequency, nocturia, numbness, tingling, seizures,  Focal weakness, Loss of consciousness,  Tremor, insomnia, depression, anxiety, and suicidal ideation.   ? ? ? ?Objective:  ?BP 132/74 (BP Location: Left Arm, Patient Position: Sitting, Cuff Size: Normal)   Pulse 82   Temp 98.3 ?F (36.8 ?C) (Oral)   Ht '5\' 1"'$  (1.549 m)   Wt 143 lb 9.6 oz (65.1 kg)   SpO2 98%   BMI 27.13 kg/m?  ? ?BP Readings from Last 3 Encounters:  ?05/04/21 132/74  ?03/05/21 119/74  ?03/01/21 114/82  ? ? ?Wt Readings from Last 3 Encounters:  ?05/04/21 143 lb 9.6 oz (65.1 kg)  ?03/05/21 142 lb (64.4 kg)  ?03/01/21 145 lb 3.2 oz (65.9 kg)  ? ? ?General Appearance:    ?   ?Throat:   ?Neck:   Supple, symmetrical, trachea midline, no adenopathy;  ?  thyroid:  no enlargement/tenderness/nodules; no carotid ?  bruit or JVD  ?Back:     Symmetric, no curvature, ROM normal, no CVA tenderness  ?Lungs:     Clear to auscultation bilaterally, respirations unlabored  ?Chest Wall:    No tenderness or deformity  ? Heart:    Regular rate and rhythm, S1 and S2 normal, no murmur, rub ?  or gallop  ?   ?Abdomen:     Soft, non-tender, bowel sounds active all four quadrants,  ?  no masses, no organomegaly  ?Genitalia:    Pelvic: cervix normal in appearance, external genitalia normal, no adnexal masses or tenderness, no cervical motion tenderness, , uterus normal size, shape, and consistency and vagina with  atrophic changes   ?Extremities:   Extremities normal, atraumatic, no cyanosis or edema  ?   ?   ? ? ?Assessment & Plan:  ? ?Problem List Items Addressed This Visit   ? ? Cervical cancer screening - Primary  ? Relevant Orders  ? Cytology - PAP( [facility])  ? Atrophic vaginitis  ?  Suggested by exam and history. . Trial of vaginal estrogen tablets  ?  ?  ? ? ?I spent 30 minutes dedicated to the care of this patient on the date of this encounter to include pre-visit review of patient's medical history,  most recent imaging studies, Face-to-face  time with the patient , and post visit ordering of testing and therapeutics.   ? ?Follow-up: No follow-ups on file. ? ? ?Crecencio Mc, MD ?

## 2021-05-06 DIAGNOSIS — N952 Postmenopausal atrophic vaginitis: Secondary | ICD-10-CM | POA: Insufficient documentation

## 2021-05-06 NOTE — Assessment & Plan Note (Signed)
Suggested by exam and history. . Trial of vaginal estrogen tablets  ?

## 2021-05-08 LAB — CYTOLOGY - PAP
Comment: NEGATIVE
Diagnosis: NEGATIVE
High risk HPV: NEGATIVE

## 2021-05-28 ENCOUNTER — Other Ambulatory Visit (INDEPENDENT_AMBULATORY_CARE_PROVIDER_SITE_OTHER): Payer: BC Managed Care – PPO

## 2021-05-28 DIAGNOSIS — I10 Essential (primary) hypertension: Secondary | ICD-10-CM

## 2021-05-28 LAB — COMPREHENSIVE METABOLIC PANEL
ALT: 12 U/L (ref 0–35)
AST: 14 U/L (ref 0–37)
Albumin: 4.2 g/dL (ref 3.5–5.2)
Alkaline Phosphatase: 59 U/L (ref 39–117)
BUN: 17 mg/dL (ref 6–23)
CO2: 28 mEq/L (ref 19–32)
Calcium: 9.5 mg/dL (ref 8.4–10.5)
Chloride: 103 mEq/L (ref 96–112)
Creatinine, Ser: 0.72 mg/dL (ref 0.40–1.20)
GFR: 89.15 mL/min (ref >60.00)
Glucose, Bld: 106 mg/dL — ABNORMAL HIGH (ref 70–99)
Potassium: 3.8 mEq/L (ref 3.5–5.1)
Sodium: 139 mEq/L (ref 135–145)
Total Bilirubin: 0.8 mg/dL (ref 0.2–1.2)
Total Protein: 6.8 g/dL (ref 6.0–8.3)

## 2021-06-06 ENCOUNTER — Other Ambulatory Visit: Payer: Self-pay | Admitting: Internal Medicine

## 2021-06-06 DIAGNOSIS — E034 Atrophy of thyroid (acquired): Secondary | ICD-10-CM

## 2021-06-06 DIAGNOSIS — Z1211 Encounter for screening for malignant neoplasm of colon: Secondary | ICD-10-CM

## 2021-06-11 ENCOUNTER — Encounter: Payer: Self-pay | Admitting: Internal Medicine

## 2021-06-12 MED ORDER — LEVOTHYROXINE SODIUM 75 MCG PO TABS: 75.0000 ug | ORAL_TABLET | Freq: Every day | ORAL | 0 refills | Status: DC

## 2021-06-12 NOTE — Telephone Encounter (Signed)
Is it okay to change to generic levothyroxine instead of brand name Synthroid? If so does she need to have a lab appt in 6 weeks?  ?

## 2021-06-17 ENCOUNTER — Encounter: Payer: Self-pay | Admitting: Internal Medicine

## 2021-07-24 ENCOUNTER — Other Ambulatory Visit: Payer: Self-pay | Admitting: Internal Medicine

## 2021-07-24 DIAGNOSIS — I10 Essential (primary) hypertension: Secondary | ICD-10-CM

## 2021-08-28 DIAGNOSIS — D2262 Melanocytic nevi of left upper limb, including shoulder: Secondary | ICD-10-CM | POA: Diagnosis not present

## 2021-08-28 DIAGNOSIS — D2272 Melanocytic nevi of left lower limb, including hip: Secondary | ICD-10-CM | POA: Diagnosis not present

## 2021-08-28 DIAGNOSIS — Z85828 Personal history of other malignant neoplasm of skin: Secondary | ICD-10-CM | POA: Diagnosis not present

## 2021-08-28 DIAGNOSIS — D2261 Melanocytic nevi of right upper limb, including shoulder: Secondary | ICD-10-CM | POA: Diagnosis not present

## 2021-09-13 ENCOUNTER — Other Ambulatory Visit: Payer: Self-pay

## 2021-09-13 MED ORDER — LEVOTHYROXINE SODIUM 75 MCG PO TABS: 75.0000 ug | ORAL_TABLET | Freq: Every day | ORAL | 0 refills | Status: DC

## 2021-11-28 ENCOUNTER — Encounter: Payer: Self-pay | Admitting: Internal Medicine

## 2021-11-28 ENCOUNTER — Ambulatory Visit (INDEPENDENT_AMBULATORY_CARE_PROVIDER_SITE_OTHER): Payer: BC Managed Care – PPO | Admitting: Internal Medicine

## 2021-11-28 VITALS — BP 128/80 | HR 71 | Temp 97.8°F | Ht 61.0 in | Wt 143.2 lb

## 2021-11-28 DIAGNOSIS — E7800 Pure hypercholesterolemia, unspecified: Secondary | ICD-10-CM

## 2021-11-28 DIAGNOSIS — I10 Essential (primary) hypertension: Secondary | ICD-10-CM

## 2021-11-28 DIAGNOSIS — E034 Atrophy of thyroid (acquired): Secondary | ICD-10-CM

## 2021-11-28 DIAGNOSIS — Z0000 Encounter for general adult medical examination without abnormal findings: Secondary | ICD-10-CM

## 2021-11-28 DIAGNOSIS — N6019 Diffuse cystic mastopathy of unspecified breast: Secondary | ICD-10-CM

## 2021-11-28 DIAGNOSIS — Z1231 Encounter for screening mammogram for malignant neoplasm of breast: Secondary | ICD-10-CM

## 2021-11-28 LAB — COMPREHENSIVE METABOLIC PANEL
ALT: 13 U/L (ref 0–35)
AST: 15 U/L (ref 0–37)
Albumin: 4.5 g/dL (ref 3.5–5.2)
Alkaline Phosphatase: 60 U/L (ref 39–117)
BUN: 18 mg/dL (ref 6–23)
CO2: 28 mEq/L (ref 19–32)
Calcium: 9.8 mg/dL (ref 8.4–10.5)
Chloride: 100 mEq/L (ref 96–112)
Creatinine, Ser: 0.72 mg/dL (ref 0.40–1.20)
GFR: 88.83 mL/min (ref >60.00)
Glucose, Bld: 98 mg/dL (ref 70–99)
Potassium: 3.7 mEq/L (ref 3.5–5.1)
Sodium: 138 mEq/L (ref 135–145)
Total Bilirubin: 1.2 mg/dL (ref 0.2–1.2)
Total Protein: 7.2 g/dL (ref 6.0–8.3)

## 2021-11-28 LAB — LIPID PANEL
Cholesterol: 156 mg/dL (ref 0–200)
HDL: 44.4 mg/dL (ref >39.00)
LDL Cholesterol: 78 mg/dL (ref 0–99)
NonHDL: 111.93
Total CHOL/HDL Ratio: 4
Triglycerides: 169 mg/dL — ABNORMAL HIGH (ref 0.0–149.0)
VLDL: 33.8 mg/dL (ref 0.0–40.0)

## 2021-11-28 LAB — TSH: TSH: 2.13 u[IU]/mL (ref 0.35–5.50)

## 2021-11-28 MED ORDER — PREMARIN 0.625 MG/GM VA CREA: TOPICAL_CREAM | VAGINAL | 12 refills | Status: DC

## 2021-11-28 NOTE — Assessment & Plan Note (Signed)
Annual breast exam done.  Annual mammograms have been normal

## 2021-11-28 NOTE — Assessment & Plan Note (Signed)
Well controlled on current regimen of maxzide.. Renal function stable, no changes today.  Lab Results  Component Value Date   CREATININE 0.72 11/28/2021   Lab Results  Component Value Date   NA 138 11/28/2021   K 3.7 11/28/2021   CL 100 11/28/2021   CO2 28 11/28/2021

## 2021-11-28 NOTE — Assessment & Plan Note (Signed)
Thyroid function is WNL on  75 mcg  dose.  No current changes needed.   Lab Results  Component Value Date   TSH 2.13 11/28/2021

## 2021-11-28 NOTE — Progress Notes (Signed)
The patient is here for annual preventive examination and management of other chronic and acute problems.   The risk factors are reflected in the social history.   The roster of all physicians providing medical care to patient - is listed in the Snapshot section of the chart.   Activities of daily living:  The patient is 100% independent in all ADLs: dressing, toileting, feeding as well as independent mobility   Home safety : The patient has smoke detectors in the home. They wear seatbelts.  There are no unsecured firearms at home. There is no violence in the home.    There is no risks for hepatitis, STDs or HIV. There is no   history of blood transfusion. They have no travel history to infectious disease endemic areas of the world.   The patient has seen their dentist in the last six month. They have seen their eye doctor in the last year. The patinet  denies slight hearing difficulty with regard to whispered voices and some television programs.  They have deferred audiologic testing in the last year.  They do not  have excessive sun exposure. Discussed the need for sun protection: hats, long sleeves and use of sunscreen if there is significant sun exposure.    Diet: the importance of a healthy diet is discussed. They do have a healthy diet.   The benefits of regular aerobic exercise were discussed. The patient  exercises  3 to 5 days per week  for  60 minutes.    Depression screen: there are no signs or vegative symptoms of depression- irritability, change in appetite, anhedonia, sadness/tearfullness.   The following portions of the patient's history were reviewed and updated as appropriate: allergies, current medications, past family history, past medical history,  past surgical history, past social history  and problem list.   Visual acuity was not assessed per patient preference since the patient has regular follow up with an  ophthalmologist. Hearing and body mass index were assessed and  reviewed.    During the course of the visit the patient was educated and counseled about appropriate screening and preventive services including : fall prevention , diabetes screening, nutrition counseling, colorectal cancer screening, and recommended immunizations.    Chief Complaint:   none   Review of Symptoms  Patient denies headache, fevers, malaise, unintentional weight loss, skin rash, eye pain, sinus congestion and sinus pain, sore throat, dysphagia,  hemoptysis , cough, dyspnea, wheezing, chest pain, palpitations, orthopnea, edema, abdominal pain, nausea, melena, diarrhea, constipation, flank pain, dysuria, hematuria, urinary  Frequency, nocturia, numbness, tingling, seizures,  Focal weakness, Loss of consciousness,  Tremor, insomnia, depression, anxiety, and suicidal ideation.    Physical Exam:  BP 128/80 (BP Location: Left Arm, Patient Position: Sitting, Cuff Size: Normal)   Pulse 71   Temp 97.8 F (36.6 C) (Oral)   Ht '5\' 1"'$  (1.549 m)   Wt 143 lb 3.2 oz (65 kg)   SpO2 97%   BMI 27.06 kg/m   General appearance: alert, cooperative and appears stated age Head: Normocephalic, without obvious abnormality, atraumatic Eyes: conjunctivae/corneas clear. PERRL, EOM's intact. Fundi benign. Ears: normal TM's and external ear canals both ears Nose: Nares normal. Septum midline. Mucosa normal. No drainage or sinus tenderness. Throat: lips, mucosa, and tongue normal; teeth and gums normal Neck: no adenopathy, no carotid bruit, no JVD, supple, symmetrical, trachea midline and thyroid not enlarged, symmetric, no tenderness/mass/nodules Lungs: clear to auscultation bilaterally Breasts: normal appearance, no masses or tenderness Heart: regular rate  and rhythm, S1, S2 normal, no murmur, click, rub or gallop Abdomen: soft, non-tender; bowel sounds normal; no masses,  no organomegaly Extremities: extremities normal, atraumatic, no cyanosis or edema Pulses: 2+ and symmetric Skin: Skin color,  texture, turgor normal. No rashes or lesions Neurologic: Alert and oriented X 3, normal strength and tone. Normal symmetric reflexes. Normal coordination and gait.     Assessment and Plan:  Visit for preventive health examination age appropriate education and counseling updated, referrals for preventative services and immunizations addressed, dietary and smoking counseling addressed, most recent labs reviewed.  I have personally reviewed and have noted:   1) the patient's medical and social history 2) The pt's use of alcohol, tobacco, and illicit drugs 3) The patient's current medications and supplements 4) Functional ability including ADL's, fall risk, home safety risk, hearing and visual impairment 5) Diet and physical activities 6) Evidence for depression or mood disorder 7) The patient's height, weight, and BMI have been recorded in the chart   I have made referrals, and provided counseling and education based on review of the above  Hypothyroidism Thyroid function is WNL on  75 mcg  dose.  No current changes needed.   Lab Results  Component Value Date   TSH 2.13 11/28/2021     Diffuse cystic mastopathy Annual breast exam done.  Annual mammograms have been normal   Hypertension Well controlled on current regimen of maxzide.. Renal function stable, no changes today.  Lab Results  Component Value Date   CREATININE 0.72 11/28/2021   Lab Results  Component Value Date   NA 138 11/28/2021   K 3.7 11/28/2021   CL 100 11/28/2021   CO2 28 11/28/2021     Updated Medication List Outpatient Encounter Medications as of 11/28/2021  Medication Sig   conjugated estrogens (PREMARIN) vaginal cream Apply with finger tip to area nightly for 2 weeks,  then twice weekly thereafter   levothyroxine (SYNTHROID) 75 MCG tablet Take 1 tablet (75 mcg total) by mouth daily.   simvastatin (ZOCOR) 20 MG tablet TAKE ONE TABLET BY MOUTH EVERY EVENING   triamterene-hydrochlorothiazide  (MAXZIDE-25) 37.5-25 MG tablet TAKE ONE TABLET BY MOUTH DAILY   [DISCONTINUED] Estradiol 10 MCG TABS vaginal tablet INSET ONE TABLET VAGINALLY EVERY NIGHT FOR 2 WEEKS;  THEN TWICE WEEKLY THEREAFTER (Patient not taking: Reported on 11/28/2021)   [DISCONTINUED] Estradiol 10 MCG TABS vaginal tablet Inset one tablet vaginally twice weekly (Patient not taking: Reported on 11/28/2021)   [DISCONTINUED] hydrocortisone 2.5 % ointment Apply topically 2 (two) times daily. Prn vaginal area (Patient not taking: Reported on 11/28/2021)   No facility-administered encounter medications on file as of 11/28/2021.

## 2021-11-28 NOTE — Assessment & Plan Note (Signed)

## 2021-11-28 NOTE — Patient Instructions (Signed)
Your mammogram has been ordered (at [facility])  The ShingRx vaccine is now available in local pharmacies and is much more protective thant Zostavaxs,  It is therefore ADVISED for all interested adults over 50 to prevent shingles

## 2021-12-04 ENCOUNTER — Encounter: Payer: Self-pay | Admitting: Internal Medicine

## 2021-12-05 NOTE — Telephone Encounter (Signed)
Please check with her pharmacy to see why they did not fill the estrogen cream with generic

## 2021-12-06 MED ORDER — ESTRADIOL 0.1 MG/GM VA CREA: 1.0000 | TOPICAL_CREAM | Freq: Every day | VAGINAL | 12 refills | Status: DC

## 2021-12-08 ENCOUNTER — Other Ambulatory Visit: Payer: Self-pay | Admitting: Internal Medicine

## 2021-12-08 DIAGNOSIS — Z1211 Encounter for screening for malignant neoplasm of colon: Secondary | ICD-10-CM

## 2021-12-11 DIAGNOSIS — Z1231 Encounter for screening mammogram for malignant neoplasm of breast: Secondary | ICD-10-CM | POA: Diagnosis not present

## 2021-12-11 LAB — HM MAMMOGRAPHY

## 2021-12-29 ENCOUNTER — Other Ambulatory Visit: Payer: Self-pay | Admitting: Internal Medicine

## 2022-01-16 DIAGNOSIS — J101 Influenza due to other identified influenza virus with other respiratory manifestations: Secondary | ICD-10-CM | POA: Diagnosis not present

## 2022-01-16 DIAGNOSIS — M791 Myalgia, unspecified site: Secondary | ICD-10-CM | POA: Diagnosis not present

## 2022-01-16 DIAGNOSIS — R059 Cough, unspecified: Secondary | ICD-10-CM | POA: Diagnosis not present

## 2022-01-17 ENCOUNTER — Other Ambulatory Visit: Payer: Self-pay | Admitting: Internal Medicine

## 2022-01-17 DIAGNOSIS — I10 Essential (primary) hypertension: Secondary | ICD-10-CM

## 2022-04-22 ENCOUNTER — Other Ambulatory Visit: Payer: Self-pay | Admitting: Internal Medicine

## 2022-05-30 ENCOUNTER — Encounter: Payer: Self-pay | Admitting: Internal Medicine

## 2022-05-30 ENCOUNTER — Ambulatory Visit (INDEPENDENT_AMBULATORY_CARE_PROVIDER_SITE_OTHER): Payer: BC Managed Care – PPO | Admitting: Internal Medicine

## 2022-05-30 VITALS — BP 126/82 | HR 76 | Temp 97.6°F | Ht 61.0 in | Wt 144.2 lb

## 2022-05-30 DIAGNOSIS — H6993 Unspecified Eustachian tube disorder, bilateral: Secondary | ICD-10-CM

## 2022-05-30 DIAGNOSIS — E034 Atrophy of thyroid (acquired): Secondary | ICD-10-CM

## 2022-05-30 DIAGNOSIS — I10 Essential (primary) hypertension: Secondary | ICD-10-CM

## 2022-05-30 DIAGNOSIS — E7800 Pure hypercholesterolemia, unspecified: Secondary | ICD-10-CM | POA: Diagnosis not present

## 2022-05-30 DIAGNOSIS — Z87898 Personal history of other specified conditions: Secondary | ICD-10-CM

## 2022-05-30 MED ORDER — TRIAMTERENE-HCTZ 37.5-25 MG PO TABS: 1.0000 | ORAL_TABLET | Freq: Every day | ORAL | 1 refills | Status: DC

## 2022-05-30 MED ORDER — LEVOTHYROXINE SODIUM 75 MCG PO TABS: 75.0000 ug | ORAL_TABLET | Freq: Every day | ORAL | 1 refills | Status: DC

## 2022-05-30 MED ORDER — DIAZEPAM 5 MG PO TABS: 5.0000 mg | ORAL_TABLET | Freq: Two times a day (BID) | ORAL | 1 refills | Status: AC | PRN

## 2022-05-30 MED ORDER — FLUTICASONE PROPIONATE 50 MCG/ACT NA SUSP: 2.0000 | Freq: Every day | NASAL | 6 refills | Status: DC

## 2022-05-30 MED ORDER — SIMVASTATIN 20 MG PO TABS: 20.0000 mg | ORAL_TABLET | Freq: Every evening | ORAL | 1 refills | Status: DC

## 2022-05-30 NOTE — Patient Instructions (Signed)
The flonase is going to shrink any swelling of the sinus turbinates and eustachian tubes and hopefully PREVENT  a full fledged episode of vertigo  Fill the valium just in case

## 2022-05-30 NOTE — Assessment & Plan Note (Signed)
Current symptoms of light headedness with sudden position change suggest  eustachian tube dysfunction,  adding flonase for use one month,   valium prn

## 2022-05-30 NOTE — Progress Notes (Signed)
Subjective:  Patient ID: Kaitlyn Cortez, female    DOB: [birth year]  Age: 62 y.o. MRN: [id number]  CC: The primary encounter diagnosis was Hypothyroidism due to acquired atrophy of thyroid. Diagnoses of Primary hypertension, Pure hypercholesterolemia, History of vertigo, and Eustachian tube dysfunction, bilateral were also pertinent to this visit.   HPI Kaitlyn BassetKaren M Cortez presents for  Chief Complaint  Patient presents with   Medical Management of Chronic Issues    1) new onset light headedness with sudden position changes (turning , sitting up suddenly)  . Lasts only a few seconds.  No vertigo or nausea ,  but has a  history of vertigo with nausea.    2) HTN : taking maxzide for BP . Not drinking enough water.  Home readings < 140/80  3) caregiver fatigue:  doing well with caring for mother Angelyn Punt(Betty McPherson)  working from home.   Outpatient Medications Prior to Visit  Medication Sig Dispense Refill   estradiol (ESTRACE) 0.1 MG/GM vaginal cream Place 1 Applicatorful vaginally at bedtime. For 2 weeks then twice weekly thereafter 42.5 g 12   levothyroxine (SYNTHROID) 75 MCG tablet TAKE 1 TABLET BY MOUTH DAILY 90 tablet 0   simvastatin (ZOCOR) 20 MG tablet TAKE ONE TABLET BY MOUTH EVERY EVENING 90 tablet 1   triamterene-hydrochlorothiazide (MAXZIDE-25) 37.5-25 MG tablet TAKE 1 TABLET BY MOUTH DAILY 90 tablet 1   No facility-administered medications prior to visit.    Review of Systems;  Patient denies headache, fevers, malaise, unintentional weight loss, skin rash, eye pain, sinus congestion and sinus pain, sore throat, dysphagia,  hemoptysis , cough, dyspnea, wheezing, chest pain, palpitations, orthopnea, edema, abdominal pain, nausea, melena, diarrhea, constipation, flank pain, dysuria, hematuria, urinary  Frequency, nocturia, numbness, tingling, seizures,  Focal weakness, Loss of consciousness,  Tremor, insomnia, depression, anxiety, and suicidal ideation.      Objective:  BP  126/82   Pulse 76   Temp 97.6 F (36.4 C) (Oral)   Ht 5\' 1"  (1.549 m)   Wt 144 lb 3.2 oz (65.4 kg)   SpO2 96%   BMI 27.25 kg/m   BP Readings from Last 3 Encounters:  05/30/22 126/82  11/28/21 128/80  05/04/21 132/74    Wt Readings from Last 3 Encounters:  05/30/22 144 lb 3.2 oz (65.4 kg)  11/28/21 143 lb 3.2 oz (65 kg)  05/04/21 143 lb 9.6 oz (65.1 kg)    Physical Exam Vitals reviewed.  Constitutional:      General: She is not in acute distress.    Appearance: Normal appearance. She is normal weight. She is not ill-appearing, toxic-appearing or diaphoretic.  HENT:     Head: Normocephalic.  Eyes:     General: No scleral icterus.       Right eye: No discharge.        Left eye: No discharge.     Conjunctiva/sclera: Conjunctivae normal.  Cardiovascular:     Rate and Rhythm: Normal rate and regular rhythm.     Heart sounds: Normal heart sounds.  Pulmonary:     Effort: Pulmonary effort is normal. No respiratory distress.     Breath sounds: Normal breath sounds.  Musculoskeletal:        General: Normal range of motion.  Skin:    General: Skin is warm and dry.  Neurological:     General: No focal deficit present.     Mental Status: She is alert and oriented to person, place, and time. Mental status is at baseline.  Cranial Nerves: No cranial nerve deficit.     Sensory: No sensory deficit.     Coordination: Coordination is intact.  Psychiatric:        Mood and Affect: Mood normal.        Behavior: Behavior normal.        Thought Content: Thought content normal.        Judgment: Judgment normal.   Lab Results  Component Value Date   HGBA1C 5.6 11/27/2020    Lab Results  Component Value Date   CREATININE 0.72 11/28/2021   CREATININE 0.72 05/28/2021   CREATININE 0.68 11/27/2020    Lab Results  Component Value Date   WBC 6.4 11/21/2017   HGB 14.9 11/21/2017   HCT 42.5 11/21/2017   PLT 285.0 11/21/2017   GLUCOSE 98 11/28/2021   CHOL 156 11/28/2021    TRIG 169.0 (H) 11/28/2021   HDL 44.40 11/28/2021   LDLDIRECT 84.0 11/23/2018   LDLCALC 78 11/28/2021   ALT 13 11/28/2021   AST 15 11/28/2021   NA 138 11/28/2021   K 3.7 11/28/2021   CL 100 11/28/2021   CREATININE 0.72 11/28/2021   BUN 18 11/28/2021   CO2 28 11/28/2021   TSH 2.13 11/28/2021   HGBA1C 5.6 11/27/2020    No results found.  Assessment & Plan:  .Hypothyroidism due to acquired atrophy of thyroid -     TSH; Future  Primary hypertension Assessment & Plan: Well controlled on current regimen of maxzide . Renal function stable, no changes today.   Lab Results  Component Value Date   CREATININE 0.72 11/28/2021   Lab Results  Component Value Date   NA 138 11/28/2021   K 3.7 11/28/2021   CL 100 11/28/2021   CO2 28 11/28/2021     Orders: -     Triamterene-HCTZ; Take 1 tablet by mouth daily.  Dispense: 90 tablet; Refill: 1 -     Comprehensive metabolic panel; Future  Pure hypercholesterolemia Assessment & Plan: Managed with simvastatin, repeat labs are due   Lab Results  Component Value Date   CHOL 156 11/28/2021   HDL 44.40 11/28/2021   LDLCALC 78 11/28/2021   LDLDIRECT 84.0 11/23/2018   TRIG 169.0 (H) 11/28/2021   CHOLHDL 4 11/28/2021     Orders: -     Lipid Panel w/reflex Direct LDL; Future  History of vertigo Assessment & Plan: Current symptoms of light headedness with sudden position change suggest  eustachian tube dysfunction,  adding flonase for use one month,   valium prn    Eustachian tube dysfunction, bilateral Assessment & Plan: Likely the cause of her BPV.  Flonase prescribed    Other orders -     Simvastatin; Take 1 tablet (20 mg total) by mouth every evening.  Dispense: 90 tablet; Refill: 1 -     diazePAM; Take 1 tablet (5 mg total) by mouth every 12 (twelve) hours as needed for anxiety.  Dispense: 30 tablet; Refill: 1 -     Fluticasone Propionate; Place 2 sprays into both nostrils daily.  Dispense: 16 g; Refill: 6 -      Levothyroxine Sodium; Take 1 tablet (75 mcg total) by mouth daily.  Dispense: 90 tablet; Refill: 1    Follow-up: Return in about 6 months (around 11/29/2022).   Sherlene Shams, MD

## 2022-05-31 DIAGNOSIS — H6993 Unspecified Eustachian tube disorder, bilateral: Secondary | ICD-10-CM | POA: Insufficient documentation

## 2022-05-31 NOTE — Assessment & Plan Note (Signed)
Likely the cause of her BPV.  Flonase prescribed

## 2022-05-31 NOTE — Assessment & Plan Note (Signed)
Managed with simvastatin, repeat labs are due   Lab Results  Component Value Date   CHOL 156 11/28/2021   HDL 44.40 11/28/2021   LDLCALC 78 11/28/2021   LDLDIRECT 84.0 11/23/2018   TRIG 169.0 (H) 11/28/2021   CHOLHDL 4 11/28/2021

## 2022-05-31 NOTE — Assessment & Plan Note (Signed)
Well controlled on current regimen of maxzide.. Renal function stable, no changes today.  Lab Results  Component Value Date   CREATININE 0.72 11/28/2021   Lab Results  Component Value Date   NA 138 11/28/2021   K 3.7 11/28/2021   CL 100 11/28/2021   CO2 28 11/28/2021    

## 2022-11-29 ENCOUNTER — Ambulatory Visit: Payer: BC Managed Care – PPO | Admitting: Internal Medicine

## 2022-12-04 ENCOUNTER — Ambulatory Visit: Payer: BC Managed Care – PPO | Admitting: Internal Medicine

## 2022-12-16 ENCOUNTER — Encounter: Payer: Self-pay | Admitting: Internal Medicine

## 2022-12-16 DIAGNOSIS — Z1231 Encounter for screening mammogram for malignant neoplasm of breast: Secondary | ICD-10-CM | POA: Diagnosis not present

## 2022-12-16 LAB — HM MAMMOGRAPHY

## 2022-12-17 DIAGNOSIS — D225 Melanocytic nevi of trunk: Secondary | ICD-10-CM | POA: Diagnosis not present

## 2022-12-17 DIAGNOSIS — D2262 Melanocytic nevi of left upper limb, including shoulder: Secondary | ICD-10-CM | POA: Diagnosis not present

## 2022-12-17 DIAGNOSIS — D2272 Melanocytic nevi of left lower limb, including hip: Secondary | ICD-10-CM | POA: Diagnosis not present

## 2022-12-17 DIAGNOSIS — D2261 Melanocytic nevi of right upper limb, including shoulder: Secondary | ICD-10-CM | POA: Diagnosis not present

## 2023-01-08 ENCOUNTER — Other Ambulatory Visit: Payer: Self-pay | Admitting: Internal Medicine

## 2023-01-18 ENCOUNTER — Other Ambulatory Visit: Payer: Self-pay | Admitting: Internal Medicine

## 2023-01-18 DIAGNOSIS — I10 Essential (primary) hypertension: Secondary | ICD-10-CM

## 2023-01-20 ENCOUNTER — Ambulatory Visit: Payer: BC Managed Care – PPO | Admitting: Internal Medicine

## 2023-02-18 ENCOUNTER — Other Ambulatory Visit: Payer: Self-pay | Admitting: Internal Medicine

## 2023-02-25 ENCOUNTER — Ambulatory Visit (INDEPENDENT_AMBULATORY_CARE_PROVIDER_SITE_OTHER): Payer: BC Managed Care – PPO | Admitting: Internal Medicine

## 2023-02-25 ENCOUNTER — Encounter: Payer: Self-pay | Admitting: Internal Medicine

## 2023-02-25 VITALS — BP 108/70 | HR 73 | Ht 61.0 in | Wt 144.6 lb

## 2023-02-25 DIAGNOSIS — I10 Essential (primary) hypertension: Secondary | ICD-10-CM

## 2023-02-25 DIAGNOSIS — E663 Overweight: Secondary | ICD-10-CM

## 2023-02-25 DIAGNOSIS — E7800 Pure hypercholesterolemia, unspecified: Secondary | ICD-10-CM | POA: Diagnosis not present

## 2023-02-25 DIAGNOSIS — E034 Atrophy of thyroid (acquired): Secondary | ICD-10-CM | POA: Diagnosis not present

## 2023-02-25 MED ORDER — ESTRADIOL 0.1 MG/GM VA CREA: 1.0000 | TOPICAL_CREAM | Freq: Every day | VAGINAL | 12 refills | Status: DC

## 2023-02-25 NOTE — Patient Instructions (Signed)
 I'm so sorry about Kaitlyn Cortez.     You'll see him again I am sure of it.  Blood pressure looks fine.  Remember to suspend your triamterene if you get a dehydrating illness    We will repeat your labs every 6 months

## 2023-02-25 NOTE — Progress Notes (Signed)
 Subjective:  Patient ID: Kaitlyn Cortez, female    DOB: 25-Dec-1960  Age: 63 y.o. MRN: [id number]  CC: The primary encounter diagnosis was Primary hypertension. Diagnoses of Hypothyroidism due to acquired atrophy of thyroid , Overweight (BMI 25.0-29.9), and Pure hypercholesterolemia were also pertinent to this visit.   HPI Kaitlyn Cortez presents for  Chief Complaint  Patient presents with   Medical Management of Chronic Issues    6 month follow up    1) HTN:  Hypertension: patient checks blood pressure twice weekly at home.  Readings have been for the most part <130/80 at rest . Patient is following a reduced salt diet most days and is taking medications as prescribed (triamteren/hct)  2) HLD :tolerating simvastatin  without body aches.  Recent labs have been ordered but not done.     Outpatient Medications Prior to Visit  Medication Sig Dispense Refill   levothyroxine  (SYNTHROID ) 75 MCG tablet TAKE 1 TABLET BY MOUTH DAILY 90 tablet 1   simvastatin  (ZOCOR ) 20 MG tablet TAKE ONE TABLET BY MOUTH EVERY EVENING 90 tablet 1   triamterene -hydrochlorothiazide (MAXZIDE-25) 37.5-25 MG tablet TAKE 1 TABLET BY MOUTH DAILY 90 tablet 1   diazepam  (VALIUM ) 5 MG tablet Take 1 tablet (5 mg total) by mouth every 12 (twelve) hours as needed for anxiety. (Patient not taking: Reported on 02/25/2023) 30 tablet 1   fluticasone  (FLONASE ) 50 MCG/ACT nasal spray Place 2 sprays into both nostrils daily. (Patient not taking: Reported on 02/25/2023) 16 g 6   estradiol  (ESTRACE ) 0.1 MG/GM vaginal cream Place 1 Applicatorful vaginally at bedtime. For 2 weeks then twice weekly thereafter (Patient not taking: Reported on 02/25/2023) 42.5 g 12   No facility-administered medications prior to visit.    Review of Systems;  Patient denies headache, fevers, malaise, unintentional weight loss, skin rash, eye pain, sinus congestion and sinus pain, sore throat, dysphagia,  hemoptysis , cough, dyspnea, wheezing, chest pain,  palpitations, orthopnea, edema, abdominal pain, nausea, melena, diarrhea, constipation, flank pain, dysuria, hematuria, urinary  Frequency, nocturia, numbness, tingling, seizures,  Focal weakness, Loss of consciousness,  Tremor, insomnia, depression, anxiety, and suicidal ideation.      Objective:  BP 108/70   Pulse 73   Ht 5' 1 (1.549 m)   Wt 144 lb 9.6 oz (65.6 kg)   SpO2 97%   BMI 27.32 kg/m   BP Readings from Last 3 Encounters:  02/25/23 108/70  05/30/22 126/82  11/28/21 128/80    Wt Readings from Last 3 Encounters:  02/25/23 144 lb 9.6 oz (65.6 kg)  05/30/22 144 lb 3.2 oz (65.4 kg)  11/28/21 143 lb 3.2 oz (65 kg)    Physical Exam Vitals reviewed.  Constitutional:      General: She is not in acute distress.    Appearance: Normal appearance. She is normal weight. She is not ill-appearing, toxic-appearing or diaphoretic.  HENT:     Head: Normocephalic.  Eyes:     General: No scleral icterus.       Right eye: No discharge.        Left eye: No discharge.     Conjunctiva/sclera: Conjunctivae normal.  Cardiovascular:     Rate and Rhythm: Normal rate and regular rhythm.     Heart sounds: Normal heart sounds.  Pulmonary:     Effort: Pulmonary effort is normal. No respiratory distress.     Breath sounds: Normal breath sounds.  Musculoskeletal:        General: Normal range of motion.  Skin:  General: Skin is warm and dry.  Neurological:     General: No focal deficit present.     Mental Status: She is alert and oriented to person, place, and time. Mental status is at baseline.  Psychiatric:        Mood and Affect: Mood normal.        Behavior: Behavior normal.        Thought Content: Thought content normal.        Judgment: Judgment normal.    Lab Results  Component Value Date   HGBA1C 5.6 11/27/2020    Lab Results  Component Value Date   CREATININE 0.72 11/28/2021   CREATININE 0.72 05/28/2021   CREATININE 0.68 11/27/2020    Lab Results  Component  Value Date   WBC 6.4 11/21/2017   HGB 14.9 11/21/2017   HCT 42.5 11/21/2017   PLT 285.0 11/21/2017   GLUCOSE 98 11/28/2021   CHOL 156 11/28/2021   TRIG 169.0 (H) 11/28/2021   HDL 44.40 11/28/2021   LDLDIRECT 84.0 11/23/2018   LDLCALC 78 11/28/2021   ALT 13 11/28/2021   AST 15 11/28/2021   NA 138 11/28/2021   K 3.7 11/28/2021   CL 100 11/28/2021   CREATININE 0.72 11/28/2021   BUN 18 11/28/2021   CO2 28 11/28/2021   TSH 2.13 11/28/2021   HGBA1C 5.6 11/27/2020    No results found.  Assessment & Plan:  .Primary hypertension Assessment & Plan: Well controlled on current regimen. Renal function  and lytes are overdue , no changes today.   Orders: -     Comprehensive metabolic panel -     Microalbumin / creatinine urine ratio  Hypothyroidism due to acquired atrophy of thyroid  Assessment & Plan: Thyroid  function has been WNL on  75 mcg  dose bt has not been checked in > 1 yr . She is asymptomatic    Lab Results  Component Value Date   TSH 2.13 11/28/2021     Orders: -     TSH  Overweight (BMI 25.0-29.9) -     Hemoglobin A1c -     CBC with Differential/Platelet  Pure hypercholesterolemia Assessment & Plan: Managed with simvastatin , repeat labs are due   Lab Results  Component Value Date   CHOL 156 11/28/2021   HDL 44.40 11/28/2021   LDLCALC 78 11/28/2021   LDLDIRECT 84.0 11/23/2018   TRIG 169.0 (H) 11/28/2021   CHOLHDL 4 11/28/2021     Orders: -     Lipid panel -     LDL cholesterol, direct  Other orders -     Estradiol ; Place 1 Applicatorful vaginally at bedtime. For 2 weeks then twice weekly thereafter  Dispense: 42.5 g; Refill: 12     Follow-up: Return in about 6 months (around 08/25/2023) for physical.   Verneita LITTIE Kettering, MD

## 2023-02-26 LAB — CBC WITH DIFFERENTIAL/PLATELET
Basophils Absolute: 0.1 10*3/uL (ref 0.0–0.1)
Basophils Relative: 1.2 % (ref 0.0–3.0)
Eosinophils Absolute: 0.2 10*3/uL (ref 0.0–0.7)
Eosinophils Relative: 2.7 % (ref 0.0–5.0)
HCT: 44.4 % (ref 36.0–46.0)
Hemoglobin: 15.2 g/dL — ABNORMAL HIGH (ref 12.0–15.0)
Lymphocytes Relative: 30.1 % (ref 12.0–46.0)
Lymphs Abs: 2.2 10*3/uL (ref 0.7–4.0)
MCHC: 34.2 g/dL (ref 30.0–36.0)
MCV: 90.6 fL (ref 78.0–100.0)
Monocytes Absolute: 0.6 10*3/uL (ref 0.1–1.0)
Monocytes Relative: 8.2 % (ref 3.0–12.0)
Neutro Abs: 4.1 10*3/uL (ref 1.4–7.7)
Neutrophils Relative %: 57.8 % (ref 43.0–77.0)
Platelets: 294 10*3/uL (ref 150.0–400.0)
RBC: 4.9 Mil/uL (ref 3.87–5.11)
RDW: 12.7 % (ref 11.5–15.5)
WBC: 7.2 10*3/uL (ref 4.0–10.5)

## 2023-02-26 LAB — COMPREHENSIVE METABOLIC PANEL
ALT: 16 U/L (ref 0–35)
AST: 18 U/L (ref 0–37)
Albumin: 4.5 g/dL (ref 3.5–5.2)
Alkaline Phosphatase: 61 U/L (ref 39–117)
BUN: 26 mg/dL — ABNORMAL HIGH (ref 6–23)
CO2: 27 meq/L (ref 19–32)
Calcium: 9.5 mg/dL (ref 8.4–10.5)
Chloride: 103 meq/L (ref 96–112)
Creatinine, Ser: 0.81 mg/dL (ref 0.40–1.20)
GFR: 76.45 mL/min (ref >60.00)
Glucose, Bld: 88 mg/dL (ref 70–99)
Potassium: 3.9 meq/L (ref 3.5–5.1)
Sodium: 138 meq/L (ref 135–145)
Total Bilirubin: 1 mg/dL (ref 0.2–1.2)
Total Protein: 7.2 g/dL (ref 6.0–8.3)

## 2023-02-26 LAB — LIPID PANEL
Cholesterol: 193 mg/dL (ref 0–200)
HDL: 43.1 mg/dL (ref >39.00)
LDL Cholesterol: 114 mg/dL — ABNORMAL HIGH (ref 0–99)
NonHDL: 149.41
Total CHOL/HDL Ratio: 4
Triglycerides: 176 mg/dL — ABNORMAL HIGH (ref 0.0–149.0)
VLDL: 35.2 mg/dL (ref 0.0–40.0)

## 2023-02-26 LAB — LDL CHOLESTEROL, DIRECT: Direct LDL: 122 mg/dL

## 2023-02-26 LAB — MICROALBUMIN / CREATININE URINE RATIO
Creatinine,U: 145.9 mg/dL
Microalb Creat Ratio: 0.6 mg/g (ref 0.0–30.0)
Microalb, Ur: 0.8 mg/dL (ref 0.0–1.9)

## 2023-02-26 LAB — TSH: TSH: 4.52 u[IU]/mL (ref 0.35–5.50)

## 2023-02-26 LAB — HEMOGLOBIN A1C: Hgb A1c MFr Bld: 5.9 % (ref 4.6–6.5)

## 2023-02-26 NOTE — Assessment & Plan Note (Signed)
 Well controlled on current regimen. Renal function  and lytes are overdue , no changes today.

## 2023-02-26 NOTE — Assessment & Plan Note (Signed)
 Thyroid function has been WNL on  75 mcg  dose bt has not been checked in > 1 yr . She is asymptomatic    Lab Results  Component Value Date   TSH 2.13 11/28/2021

## 2023-02-26 NOTE — Assessment & Plan Note (Signed)
Managed with simvastatin, repeat labs are due   Lab Results  Component Value Date   CHOL 156 11/28/2021   HDL 44.40 11/28/2021   LDLCALC 78 11/28/2021   LDLDIRECT 84.0 11/23/2018   TRIG 169.0 (H) 11/28/2021   CHOLHDL 4 11/28/2021

## 2023-03-10 DIAGNOSIS — Z0100 Encounter for examination of eyes and vision without abnormal findings: Secondary | ICD-10-CM | POA: Diagnosis not present

## 2023-03-10 DIAGNOSIS — H2513 Age-related nuclear cataract, bilateral: Secondary | ICD-10-CM | POA: Diagnosis not present

## 2023-07-11 ENCOUNTER — Other Ambulatory Visit: Payer: Self-pay | Admitting: Internal Medicine

## 2023-07-26 ENCOUNTER — Other Ambulatory Visit: Payer: Self-pay | Admitting: Internal Medicine

## 2023-07-26 DIAGNOSIS — I10 Essential (primary) hypertension: Secondary | ICD-10-CM

## 2023-08-25 ENCOUNTER — Encounter: Payer: BC Managed Care – PPO | Admitting: Internal Medicine

## 2023-08-26 ENCOUNTER — Ambulatory Visit (INDEPENDENT_AMBULATORY_CARE_PROVIDER_SITE_OTHER): Admitting: Internal Medicine

## 2023-08-26 ENCOUNTER — Other Ambulatory Visit (HOSPITAL_COMMUNITY)
Admission: RE | Admit: 2023-08-26 | Discharge: 2023-08-26 | Disposition: A | Discharge status: Home / Self Care | Source: Ambulatory Visit | Attending: Internal Medicine | Admitting: Internal Medicine

## 2023-08-26 ENCOUNTER — Encounter: Payer: Self-pay | Admitting: Internal Medicine

## 2023-08-26 VITALS — BP 140/74 | HR 71 | Ht 61.0 in | Wt 142.0 lb

## 2023-08-26 DIAGNOSIS — Z124 Encounter for screening for malignant neoplasm of cervix: Secondary | ICD-10-CM

## 2023-08-26 DIAGNOSIS — Z87898 Personal history of other specified conditions: Secondary | ICD-10-CM

## 2023-08-26 DIAGNOSIS — Z0000 Encounter for general adult medical examination without abnormal findings: Secondary | ICD-10-CM

## 2023-08-26 DIAGNOSIS — Z23 Encounter for immunization: Secondary | ICD-10-CM

## 2023-08-26 DIAGNOSIS — I10 Essential (primary) hypertension: Secondary | ICD-10-CM

## 2023-08-26 DIAGNOSIS — Z1231 Encounter for screening mammogram for malignant neoplasm of breast: Secondary | ICD-10-CM

## 2023-08-26 DIAGNOSIS — Z780 Asymptomatic menopausal state: Secondary | ICD-10-CM

## 2023-08-26 DIAGNOSIS — E034 Atrophy of thyroid (acquired): Secondary | ICD-10-CM | POA: Diagnosis not present

## 2023-08-26 DIAGNOSIS — N952 Postmenopausal atrophic vaginitis: Secondary | ICD-10-CM

## 2023-08-26 DIAGNOSIS — E7800 Pure hypercholesterolemia, unspecified: Secondary | ICD-10-CM

## 2023-08-26 LAB — COMPREHENSIVE METABOLIC PANEL WITH GFR
ALT: 13 U/L (ref 0–35)
AST: 16 U/L (ref 0–37)
Albumin: 4.8 g/dL (ref 3.5–5.2)
Alkaline Phosphatase: 69 U/L (ref 39–117)
BUN: 17 mg/dL (ref 6–23)
CO2: 29 meq/L (ref 19–32)
Calcium: 9.8 mg/dL (ref 8.4–10.5)
Chloride: 99 meq/L (ref 96–112)
Creatinine, Ser: 0.7 mg/dL (ref 0.40–1.20)
GFR: 90.77 mL/min (ref >60.00)
Glucose, Bld: 101 mg/dL — ABNORMAL HIGH (ref 70–99)
Potassium: 3.9 meq/L (ref 3.5–5.1)
Sodium: 136 meq/L (ref 135–145)
Total Bilirubin: 0.9 mg/dL (ref 0.2–1.2)
Total Protein: 7.4 g/dL (ref 6.0–8.3)

## 2023-08-26 LAB — LIPID PANEL
Cholesterol: 169 mg/dL (ref 0–200)
HDL: 44.5 mg/dL (ref >39.00)
LDL Cholesterol: 93 mg/dL (ref 0–99)
NonHDL: 124.52
Total CHOL/HDL Ratio: 4
Triglycerides: 156 mg/dL — ABNORMAL HIGH (ref 0.0–149.0)
VLDL: 31.2 mg/dL (ref 0.0–40.0)

## 2023-08-26 LAB — LDL CHOLESTEROL, DIRECT: Direct LDL: 106 mg/dL

## 2023-08-26 MED ORDER — LEVOTHYROXINE SODIUM 75 MCG PO TABS: 75.0000 ug | ORAL_TABLET | Freq: Every day | ORAL | 1 refills | Status: DC

## 2023-08-26 NOTE — Assessment & Plan Note (Signed)
 Has declined refills of estrace ,

## 2023-08-26 NOTE — Assessment & Plan Note (Signed)
 Thyroid  function has been WNL on  75 mcg  dose bt has not been checked in > 1 yr . She is asymptomatic    Lab Results  Component Value Date   TSH 4.52 02/25/2023

## 2023-08-26 NOTE — Assessment & Plan Note (Signed)
 Remote history ,  resolved with    valium  prn

## 2023-08-26 NOTE — Patient Instructions (Addendum)
 YOUR MAMMOGRAM and BONE DENSITY SCAN IS DUE IN OCTOBER 2025, PLEASE CALL AND GET THIS SCHEDULED! [facility]: [phone]  You received a pneumonia vaccine today  no repeats needed   You are overdue for your 10 yr booster ot the tetanus-diptheria-pertussis vaccine   (TDaP)   You can get it at your pharmacy for free    You can request a referral for hearing test anytime you choose

## 2023-08-26 NOTE — Progress Notes (Unsigned)
 Patient ID: Kaitlyn Cortez, female    DOB: 20-May-1960  Age: 63 y.o. MRN: [id number]  The patient is here for annual preventive wellness examination and management of other chronic and acute problems.   The risk factors are reflected in the social history.  The roster of all physicians providing medical care to patient - is listed in the Snapshot section of the chart.  Activities of daily living:  The patient is 100% independent in all ADLs: dressing, toileting, feeding as well as independent mobility  Home safety : The patient has smoke detectors in the home. They wear seatbelts.  There are no firearms at home. There is no violence in the home.   There is no risks for hepatitis, STDs or HIV. There is no   history of blood transfusion. They have no travel history to infectious disease endemic areas of the world.  The patient has seen their dentist in the last six month. They have seen their eye doctor in the last year. They admit to slight hearing difficulty with regard to whispered voices and some television programs.  They have deferred audiologic testing in the last year.  They do not  have excessive sun exposure. Discussed the need for sun protection: hats, long sleeves and use of sunscreen if there is significant sun exposure.   Diet: the importance of a healthy diet is discussed. They do have a healthy diet.  The benefits of regular aerobic exercise were discussed. She walks  5 times per week ,  30 minutes.   Depression screen: there are no signs or vegative symptoms of depression- irritability, change in appetite, anhedonia, sadness/tearfullness.  Cognitive assessment: the patient manages all their financial and personal affairs and is actively engaged. They could relate day,date,year and events; recalled 2/3 objects at 3 minutes; performed clock-face test normally.  The following portions of the patient's history were reviewed and updated as appropriate: allergies, current medications,  past family history, past medical history,  past surgical history, past social history  and problem list.  Visual acuity was not assessed per patient preference since she has regular follow up with her ophthalmologist. Hearing and body mass index were assessed and reviewed.   During the course of the visit the patient was educated and counseled about appropriate screening and preventive services including : fall prevention , diabetes screening, nutrition counseling, colorectal cancer screening, and recommended immunizations.    CC: The primary encounter diagnosis was Cervical cancer screening. Diagnoses of Postmenopausal estrogen deficiency, Encounter for screening mammogram for malignant neoplasm of breast, Primary hypertension, Hypothyroidism due to acquired atrophy of thyroid , Pure hypercholesterolemia, Visit for preventive health examination, Atrophic vaginitis, History of vertigo, and Need for pneumococcal 20-valent conjugate vaccination were also pertinent to this visit.  History Kaitlyn Cortez has a past medical history of Anemia, Diffuse cystic mastopathy (2011), Hyperlipidemia, Hypertension, hypothyroidism, Menopausal menorrhagia (2009), Overweight (BMI 25.0-29.9), and Unspecified disorder of thyroid  (2011).   She has a past surgical history that includes Biopsy thyroid  (2010); Tubal ligation; Breast biopsy (2011); Hysteroscopy (2009); Colonoscopy (2011); and Colonoscopy with propofol  (N/A, 03/05/2021).   Her family history includes CAD (age of onset: 28) in her mother; Heart disease in her maternal uncle; Heart disease (age of onset: 83) in her father.She reports that she has never smoked. She has never used smokeless tobacco. She reports current alcohol use. She reports that she does not use drugs.  Outpatient Medications Prior to Visit  Medication Sig Dispense Refill   simvastatin  (ZOCOR ) 20 MG tablet  TAKE ONE TABLET BY MOUTH EVERY EVENING 90 tablet 1   triamterene -hydrochlorothiazide  (MAXZIDE-25) 37.5-25 MG tablet TAKE 1 TABLET BY MOUTH DAILY 90 tablet 1   levothyroxine  (SYNTHROID ) 75 MCG tablet TAKE 1 TABLET BY MOUTH DAILY 90 tablet 1   diazepam  (VALIUM ) 5 MG tablet Take 1 tablet (5 mg total) by mouth every 12 (twelve) hours as needed for anxiety. (Patient not taking: Reported on 08/26/2023) 30 tablet 1   estradiol  (ESTRACE ) 0.1 MG/GM vaginal cream Place 1 Applicatorful vaginally at bedtime. For 2 weeks then twice weekly thereafter (Patient not taking: Reported on 08/26/2023) 42.5 g 12   fluticasone  (FLONASE ) 50 MCG/ACT nasal spray Place 2 sprays into both nostrils daily. (Patient not taking: Reported on 08/26/2023) 16 g 6   No facility-administered medications prior to visit.    Review of Systems  Patient denies headache, fevers, malaise, unintentional weight loss, skin rash, eye pain, sinus congestion and sinus pain, sore throat, dysphagia,  hemoptysis , cough, dyspnea, wheezing, chest pain, palpitations, orthopnea, edema, abdominal pain, nausea, melena, diarrhea, constipation, flank pain, dysuria, hematuria, urinary  Frequency, nocturia, numbness, tingling, seizures,  Focal weakness, Loss of consciousness,  Tremor, insomnia, depression, anxiety, and suicidal ideation.     Objective:  BP (!) 140/74   Pulse 71   Ht 5' 1 (1.549 m)   Wt 142 lb (64.4 kg)   SpO2 99%   BMI 26.83 kg/m   Physical Exam Vitals reviewed.  Constitutional:      General: She is not in acute distress.    Appearance: Normal appearance. She is well-developed and normal weight. She is not ill-appearing, toxic-appearing or diaphoretic.  HENT:     Head: Normocephalic.     Right Ear: Tympanic membrane, ear canal and external ear normal. There is no impacted cerumen.     Left Ear: Tympanic membrane, ear canal and external ear normal. There is no impacted cerumen.     Nose: Nose normal.     Mouth/Throat:     Mouth: Mucous membranes are moist.     Pharynx: Oropharynx is clear.  Eyes:     General: No  scleral icterus.       Right eye: No discharge.        Left eye: No discharge.     Conjunctiva/sclera: Conjunctivae normal.     Pupils: Pupils are equal, round, and reactive to light.  Neck:     Thyroid : No thyromegaly.     Vascular: No carotid bruit or JVD.  Cardiovascular:     Rate and Rhythm: Normal rate and regular rhythm.     Heart sounds: Normal heart sounds.  Pulmonary:     Effort: Pulmonary effort is normal. No respiratory distress.     Breath sounds: Normal breath sounds.  Chest:  Breasts:    Breasts are symmetrical.     Right: Normal. No swelling, inverted nipple, mass, nipple discharge, skin change or tenderness.     Left: Normal. No swelling, inverted nipple, mass, nipple discharge, skin change or tenderness.  Abdominal:     General: Bowel sounds are normal.     Palpations: Abdomen is soft. There is no mass.     Tenderness: There is no abdominal tenderness. There is no guarding or rebound.     Hernia: There is no hernia in the left inguinal area or right inguinal area.  Genitourinary:    Exam position: Lithotomy position.     Pubic Area: No rash or pubic lice.      Labia:  Right: No rash, tenderness, lesion or injury.        Left: No rash, tenderness, lesion or injury.      Vagina: Normal.     Cervix: Normal.     Uterus: Normal.      Adnexa: Right adnexa normal and left adnexa normal.  Musculoskeletal:        General: Normal range of motion.     Cervical back: Normal range of motion and neck supple.  Lymphadenopathy:     Cervical: No cervical adenopathy.     Upper Body:     Right upper body: No supraclavicular, axillary or pectoral adenopathy.     Left upper body: No supraclavicular, axillary or pectoral adenopathy.     Lower Body: No right inguinal adenopathy. No left inguinal adenopathy.  Skin:    General: Skin is warm and dry.  Neurological:     General: No focal deficit present.     Mental Status: She is alert and oriented to person, place, and  time. Mental status is at baseline.  Psychiatric:        Mood and Affect: Mood normal.        Behavior: Behavior normal.        Thought Content: Thought content normal.        Judgment: Judgment normal.     Assessment & Plan:  Cervical cancer screening -     Cytology - PAP  Postmenopausal estrogen deficiency -     DG Bone Density; Future  Encounter for screening mammogram for malignant neoplasm of breast -     3D Screening Mammogram, Left and Right; Future  Primary hypertension Assessment & Plan: Well controlled on current regimen. Renal function  and lytes are overdue , no changes today.   Lab Results  Component Value Date   NA 136 08/26/2023   K 3.9 08/26/2023   CL 99 08/26/2023   CO2 29 08/26/2023   Lab Results  Component Value Date   CREATININE 0.70 08/26/2023      Hypothyroidism due to acquired atrophy of thyroid  Assessment & Plan: Thyroid  function has been WNL on  75 mcg  dose bt has not been checked in > 1 yr . She is asymptomatic    Lab Results  Component Value Date   TSH 4.52 02/25/2023     Orders: -     Comprehensive metabolic panel with GFR  Pure hypercholesterolemia -     Lipid panel -     LDL cholesterol, direct  Visit for preventive health examination Assessment & Plan: age appropriate education and counseling updated, referrals for preventative services and immunizations addressed, dietary and smoking counseling addressed, most recent labs reviewed.  I have personally reviewed and have noted:   1) the patient's medical and social history 2) The pt's use of alcohol, tobacco, and illicit drugs 3) The patient's current medications and supplements 4) Functional ability including ADL's, fall risk, home safety risk, hearing and visual impairment 5) Diet and physical activities 6) Evidence for depression or mood disorder 7) The patient's height, weight, and BMI have been recorded in the chart   I have made referrals, and provided counseling  and education based on review of the above   Orders: -     Pneumococcal conjugate vaccine 20-valent  Atrophic vaginitis Assessment & Plan: Has declined refills of estrace ,   History of vertigo Assessment & Plan: Remote history ,  resolved with    valium  prn    Need for pneumococcal 20-valent  conjugate vaccination  Other orders -     Levothyroxine  Sodium; Take 1 tablet (75 mcg total) by mouth daily.  Dispense: 90 tablet; Refill: 1      I provided 40 minutes of  face-to-face time during this encounter reviewing patient's current problems and past surgeries,  recent labs and imaging studies, providing counseling on the above mentioned problems , and coordination  of care .   Follow-up: Return in about 6 months (around 02/26/2024) for hypertension.   Kaitlyn LITTIE Kettering, MD

## 2023-08-27 NOTE — Assessment & Plan Note (Signed)
 Well controlled on current regimen. Renal function  and lytes are overdue , no changes today.   Lab Results  Component Value Date   NA 136 08/26/2023   K 3.9 08/26/2023   CL 99 08/26/2023   CO2 29 08/26/2023   Lab Results  Component Value Date   CREATININE 0.70 08/26/2023

## 2023-08-27 NOTE — Assessment & Plan Note (Signed)

## 2023-08-28 ENCOUNTER — Ambulatory Visit: Payer: Self-pay | Admitting: Internal Medicine

## 2023-08-28 LAB — CYTOLOGY - PAP
Adequacy: ABSENT
Diagnosis: NEGATIVE

## 2023-09-22 DIAGNOSIS — S52592A Other fractures of lower end of left radius, initial encounter for closed fracture: Secondary | ICD-10-CM | POA: Diagnosis not present

## 2023-10-01 DIAGNOSIS — S52592A Other fractures of lower end of left radius, initial encounter for closed fracture: Secondary | ICD-10-CM | POA: Diagnosis not present

## 2023-10-10 DIAGNOSIS — S52592A Other fractures of lower end of left radius, initial encounter for closed fracture: Secondary | ICD-10-CM | POA: Diagnosis not present

## 2023-10-10 DIAGNOSIS — S52532D Colles' fracture of left radius, subsequent encounter for closed fracture with routine healing: Secondary | ICD-10-CM | POA: Diagnosis not present

## 2023-10-31 DIAGNOSIS — S52592A Other fractures of lower end of left radius, initial encounter for closed fracture: Secondary | ICD-10-CM | POA: Diagnosis not present

## 2023-10-31 DIAGNOSIS — S52615D Nondisplaced fracture of left ulna styloid process, subsequent encounter for closed fracture with routine healing: Secondary | ICD-10-CM | POA: Diagnosis not present

## 2023-10-31 DIAGNOSIS — S52552D Other extraarticular fracture of lower end of left radius, subsequent encounter for closed fracture with routine healing: Secondary | ICD-10-CM | POA: Diagnosis not present

## 2024-01-11 ENCOUNTER — Other Ambulatory Visit: Payer: Self-pay | Admitting: Internal Medicine

## 2024-01-22 ENCOUNTER — Other Ambulatory Visit: Payer: Self-pay | Admitting: Internal Medicine

## 2024-01-22 DIAGNOSIS — I10 Essential (primary) hypertension: Secondary | ICD-10-CM

## 2024-02-22 ENCOUNTER — Other Ambulatory Visit: Payer: Self-pay | Admitting: Internal Medicine

## 2024-02-26 ENCOUNTER — Encounter: Payer: Self-pay | Admitting: Internal Medicine

## 2024-02-26 ENCOUNTER — Ambulatory Visit: Admitting: Internal Medicine

## 2024-02-26 VITALS — BP 118/74 | HR 69 | Temp 97.9°F | Ht 61.0 in | Wt 148.0 lb

## 2024-02-26 DIAGNOSIS — I10 Essential (primary) hypertension: Secondary | ICD-10-CM | POA: Diagnosis not present

## 2024-02-26 DIAGNOSIS — R5383 Other fatigue: Secondary | ICD-10-CM

## 2024-02-26 DIAGNOSIS — E034 Atrophy of thyroid (acquired): Secondary | ICD-10-CM

## 2024-02-26 DIAGNOSIS — R7301 Impaired fasting glucose: Secondary | ICD-10-CM | POA: Diagnosis not present

## 2024-02-26 DIAGNOSIS — Z0000 Encounter for general adult medical examination without abnormal findings: Secondary | ICD-10-CM | POA: Diagnosis not present

## 2024-02-26 DIAGNOSIS — E7800 Pure hypercholesterolemia, unspecified: Secondary | ICD-10-CM

## 2024-02-26 DIAGNOSIS — E559 Vitamin D deficiency, unspecified: Secondary | ICD-10-CM | POA: Diagnosis not present

## 2024-02-26 LAB — HEMOGLOBIN A1C: Hgb A1c MFr Bld: 5.7 % (ref 4.6–6.5)

## 2024-02-26 LAB — CBC WITH DIFFERENTIAL/PLATELET
Basophils Absolute: 0.1 K/uL (ref 0.0–0.1)
Basophils Relative: 0.9 % (ref 0.0–3.0)
Eosinophils Absolute: 0.2 K/uL (ref 0.0–0.7)
Eosinophils Relative: 2.8 % (ref 0.0–5.0)
HCT: 42.3 % (ref 36.0–46.0)
Hemoglobin: 14.8 g/dL (ref 12.0–15.0)
Lymphocytes Relative: 36.5 % (ref 12.0–46.0)
Lymphs Abs: 2.2 K/uL (ref 0.7–4.0)
MCHC: 35 g/dL (ref 30.0–36.0)
MCV: 87.8 fl (ref 78.0–100.0)
Monocytes Absolute: 0.4 K/uL (ref 0.1–1.0)
Monocytes Relative: 7.3 % (ref 3.0–12.0)
Neutro Abs: 3.2 K/uL (ref 1.4–7.7)
Neutrophils Relative %: 52.5 % (ref 43.0–77.0)
Platelets: 268 K/uL (ref 150.0–400.0)
RBC: 4.82 Mil/uL (ref 3.87–5.11)
RDW: 12.9 % (ref 11.5–15.5)
WBC: 6.1 K/uL (ref 4.0–10.5)

## 2024-02-26 LAB — LIPID PANEL
Cholesterol: 169 mg/dL (ref 28–200)
HDL: 44.3 mg/dL
LDL Cholesterol: 95 mg/dL (ref 10–99)
NonHDL: 124.36
Total CHOL/HDL Ratio: 4
Triglycerides: 149 mg/dL (ref 10.0–149.0)
VLDL: 29.8 mg/dL (ref 0.0–40.0)

## 2024-02-26 LAB — VITAMIN D 25 HYDROXY (VIT D DEFICIENCY, FRACTURES): VITD: 16.68 ng/mL — ABNORMAL LOW (ref 30.00–100.00)

## 2024-02-26 LAB — COMPREHENSIVE METABOLIC PANEL WITH GFR
ALT: 18 U/L (ref 3–35)
AST: 18 U/L (ref 5–37)
Albumin: 4.5 g/dL (ref 3.5–5.2)
Alkaline Phosphatase: 63 U/L (ref 39–117)
BUN: 22 mg/dL (ref 6–23)
CO2: 30 meq/L (ref 19–32)
Calcium: 9.5 mg/dL (ref 8.4–10.5)
Chloride: 102 meq/L (ref 96–112)
Creatinine, Ser: 0.78 mg/dL (ref 0.40–1.20)
GFR: 79.43 mL/min
Glucose, Bld: 92 mg/dL (ref 70–99)
Potassium: 4.1 meq/L (ref 3.5–5.1)
Sodium: 139 meq/L (ref 135–145)
Total Bilirubin: 0.9 mg/dL (ref 0.2–1.2)
Total Protein: 7.1 g/dL (ref 6.0–8.3)

## 2024-02-26 LAB — TSH: TSH: 2.02 u[IU]/mL (ref 0.35–5.50)

## 2024-02-26 LAB — LDL CHOLESTEROL, DIRECT: Direct LDL: 106 mg/dL

## 2024-02-26 MED ORDER — TETANUS-DIPHTH-ACELL PERTUSSIS 5-2-15.5 LF-MCG/0.5 IM SUSP: 0.5000 mL | Freq: Once | INTRAMUSCULAR | 0 refills | Status: AC

## 2024-02-26 NOTE — Patient Instructions (Addendum)
 You need a bone density test called a DEXA.  It  has been ordered ; you can call [facility] and schedule it there  I recommend getting the majority of your calcium and Vitamin D   through diet rather than supplements given the recent association of calcium supplements with increased coronary artery calcium scores,  you need 1200 mg daily   You are due for your tetanus-diptheria-pertussis vaccine   (TDaP)   please get your Tdap vaccine this winter at [facility]

## 2024-02-26 NOTE — Assessment & Plan Note (Addendum)
 age appropriate education and counseling updated, referrals for preventative services and immunizations addressed, dietary and smoking counseling addressed, most recent labs reviewed.  I have personally reviewed and have noted:   1) the patient's medical and social history 2) The pt's use of alcohol, tobacco, and illicit drugs 3) The patient's current medications and supplements 4) Functional ability including ADL's, fall risk, home safety risk, hearing and visual impairment 5) Diet and physical activities 6) Evidence for depression or mood disorder 7) The patient's height, weight, and BMI have been recorded in the chart 8) I have ordered and reviewed a 12 lead EKG and find that there are no acute changes and patient is in sinus rhythm.     I have made referrals, and provided counseling and education based on review of the above .  Mammogram is schedules at [facility] in [place]

## 2024-02-26 NOTE — Progress Notes (Unsigned)
 "  Chief Complaint  Patient presents with   welcome to medicare     Subjective:   Kaitlyn Cortez is a 64 y.o. female who presents for a Welcome to Medicare Exam.   Fall Screening Falls in the past year?: 0 Number of falls in past year: 0 Was there an injury with Fall?: 0 Fall Risk Category Calculator: 0 Patient Fall Risk Level: Low Fall Risk  Fall Risk Patient at Risk for Falls Due to: No Fall Risks Fall risk Follow up: Falls evaluation completed  Patient  fractured a bone in her left wrist in August after falling (stepped on dog's ball)  treated by [facility] with cast,  no surgery.  .Nondisplaced distal radius fracture. However paitnet states that the ulnear side of wrist is stil painful.  Hand feels weird/numb in the morning upon waking   Allergies (verified) Patient has no known allergies.   Current Medications (verified) Outpatient Encounter Medications as of 02/26/2024  Medication Sig   levothyroxine  (SYNTHROID ) 75 MCG tablet TAKE 1 TABLET BY MOUTH DAILY   simvastatin  (ZOCOR ) 20 MG tablet TAKE 1 TABLET BY MOUTH EVERY EVENING   triamterene -hydrochlorothiazide (MAXZIDE-25) 37.5-25 MG tablet TAKE 1 TABLET BY MOUTH DAILY   diazepam  (VALIUM ) 5 MG tablet Take 1 tablet (5 mg total) by mouth every 12 (twelve) hours as needed for anxiety. (Patient not taking: Reported on 02/26/2024)   No facility-administered encounter medications on file as of 02/26/2024.    History: Past Medical History:  Diagnosis Date   Anemia    Diffuse cystic mastopathy 2011   Hyperlipidemia    Hypertension    hypothyroidism    Menopausal menorrhagia 2009   hysterosocopy was normal,  menses ended 2009   Overweight (BMI 25.0-29.9)    Unspecified disorder of thyroid  2011   Past Surgical History:  Procedure Laterality Date   BIOPSY THYROID   2010   normal, Sankar  takes low dose synthroid  for suppression   BREAST BIOPSY  2011   fibrocystic breast disease   COLONOSCOPY  2011    COLONOSCOPY WITH PROPOFOL  N/A 03/05/2021   Procedure: COLONOSCOPY WITH PROPOFOL ;  Surgeon: Therisa Bi, MD;  Location: [facility] ENDOSCOPY;  Service: Gastroenterology;  Laterality: N/A;   HYSTEROSCOPY  2009   TUBAL LIGATION     Family History  Problem Relation Age of Onset   Heart disease Father 1   Heart disease Maternal Uncle    CAD Mother 21   Social History   Occupational History   Not on file  Tobacco Use   Smoking status: Never   Smokeless tobacco: Never  Vaping Use   Vaping status: Never Used  Substance and Sexual Activity   Alcohol use: Yes    Comment: occ   Drug use: No   Sexual activity: Not on file   Tobacco Counseling Counseling given: Not Answered  SDOH Screenings   Food Insecurity: No Food Insecurity (02/26/2024)  Housing: Low Risk (02/26/2024)  Transportation Needs: No Transportation Needs (02/26/2024)  Utilities: Not At Risk (02/26/2024)  Depression (PHQ2-9): Low Risk (02/26/2024)  Physical Activity: Insufficiently Active (02/26/2024)  Social Connections: Moderately Integrated (02/26/2024)  Stress: No Stress Concern Present (02/26/2024)  Tobacco Use: Low Risk (02/26/2024)  Health Literacy: Adequate Health Literacy (02/26/2024)   See flowsheets for full screening details  Depression Screen PHQ 2 & 9 Depression Scale- Over the past 2 weeks, how often have you been bothered by any of the following problems? Little interest or pleasure in doing things: 0 Feeling down, depressed,  or hopeless (PHQ Adolescent also includes...irritable): 0 PHQ-2 Total Score: 0      Goals Addressed   None          Objective:    Today's Vitals   02/26/24 0933  BP: 118/74  Pulse: 69  Temp: 97.9 F (36.6 C)  SpO2: 98%  Weight: 148 lb (67.1 kg)  Height: 5' 1 (1.549 m)   Body mass index is 27.96 kg/m.   Physical Exam Vitals reviewed.  Constitutional:      General: She is not in acute distress.    Appearance: Normal appearance. She is normal weight. She is not  ill-appearing, toxic-appearing or diaphoretic.  HENT:     Head: Normocephalic.  Eyes:     General: No scleral icterus.       Right eye: No discharge.        Left eye: No discharge.     Conjunctiva/sclera: Conjunctivae normal.  Cardiovascular:     Rate and Rhythm: Normal rate and regular rhythm.     Heart sounds: Normal heart sounds.  Pulmonary:     Effort: Pulmonary effort is normal. No respiratory distress.     Breath sounds: Normal breath sounds.  Musculoskeletal:        General: Normal range of motion.  Skin:    General: Skin is warm and dry.  Neurological:     General: No focal deficit present.     Mental Status: She is alert and oriented to person, place, and time. Mental status is at baseline.  Psychiatric:        Mood and Affect: Mood normal.        Behavior: Behavior normal.        Thought Content: Thought content normal.        Judgment: Judgment normal.      Hearing/Vision screen Vision Screening   Right eye Left eye Both eyes  Without correction     With correction 20/40 20/25 20/25    Immunizations and Health Maintenance Health Maintenance  Topic Date Due   Zoster Vaccines- Shingrix (1 of 2) Never done   DTaP/Tdap/Td (3 - Td or Tdap) 09/19/2022   Bone Density Scan  Never done   COVID-19 Vaccine (3 - 2025-26 season) 10/20/2023   Mammogram  12/16/2023   Medicare Annual Wellness (AWV)  02/25/2025   Cervical Cancer Screening (Pap smear)  08/25/2025   Colonoscopy  03/05/2028   Pneumococcal Vaccine: 50+ Years  Completed   Influenza Vaccine  Completed   Hepatitis C Screening  Completed   HIV Screening  Completed   Hepatitis B Vaccines 19-59 Average Risk  Aged Out   Meningococcal B Vaccine  Aged Out    EKG: normal EKG, normal sinus rhythm, unchanged from previous tracings     Assessment/Plan:  This is a routine wellness examination for Kaitlyn Cortez.  Patient Care Team: Kaitlyn Verneita CROME, MD as PCP - General (Internal Medicine) Kaitlyn Louanne MATSU, MD (General Surgery) Kaitlyn Oneil FALCON, MD (Internal Medicine)  I have personally reviewed and noted the following in the patients chart:   Medical and social history Use of alcohol, tobacco or illicit drugs  Current medications and supplements including opioid prescriptions. Functional ability and status Nutritional status Physical activity Advanced directives List of other physicians Hospitalizations, surgeries, and ER visits in previous 12 months Vitals Screenings to include cognitive, depression, and falls Referrals and appointments  Orders Placed This Encounter  Procedures   EKG 12-Lead   In addition, I have reviewed and discussed  with patient certain preventive protocols, quality metrics, and best practice recommendations. A written personalized care plan for preventive services as well as general preventive health recommendations were provided to patient.   Almarie DELENA Hummer, CMA   02/26/2024   Return in 1 year (on 02/25/2025).  "

## 2024-02-28 ENCOUNTER — Ambulatory Visit: Payer: Self-pay | Admitting: Internal Medicine

## 2024-02-28 DIAGNOSIS — E559 Vitamin D deficiency, unspecified: Secondary | ICD-10-CM

## 2024-02-28 MED ORDER — ERGOCALCIFEROL 1.25 MG (50000 UT) PO CAPS: 50000.0000 [IU] | ORAL_CAPSULE | ORAL | 0 refills | Status: AC

## 2024-02-28 NOTE — Assessment & Plan Note (Signed)
"     I am calling in a megadose of Vit D to take once weekly for a total of 3 months, "
